# Patient Record
Sex: Female | Born: 1995 | Race: Black or African American | Hispanic: No | Marital: Single | Smoking: Never smoker
Health system: Southern US, Community
[De-identification: ages and names within clinical notes are randomized; demographics above are authoritative.]

## PROBLEM LIST (undated history)

## (undated) DIAGNOSIS — J45909 Unspecified asthma, uncomplicated: Secondary | ICD-10-CM

---
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
## 2016-02-07 ENCOUNTER — Ambulatory Visit (INDEPENDENT_AMBULATORY_CARE_PROVIDER_SITE_OTHER): Payer: Medicaid Other | Admitting: *Deleted

## 2016-02-07 DIAGNOSIS — Z3049 Encounter for surveillance of other contraceptives: Secondary | ICD-10-CM | POA: Diagnosis not present

## 2016-02-07 DIAGNOSIS — Z3042 Encounter for surveillance of injectable contraceptive: Secondary | ICD-10-CM

## 2016-02-07 MED ORDER — MEDROXYPROGESTERONE ACETATE 150 MG/ML IM SUSP
150.0000 mg | Freq: Once | INTRAMUSCULAR | Status: AC
  Administered 2016-02-07: 150 mg via INTRAMUSCULAR

## 2016-02-07 NOTE — Progress Notes (Signed)
Pt presents for depo injection. Pt within depo window, no urine hcg needed. Injection given, tolerated well. F/u depo injection visit scheduled.   

## 2016-04-25 ENCOUNTER — Ambulatory Visit (INDEPENDENT_AMBULATORY_CARE_PROVIDER_SITE_OTHER): Payer: Medicaid Other | Admitting: *Deleted

## 2016-04-25 DIAGNOSIS — Z3042 Encounter for surveillance of injectable contraceptive: Secondary | ICD-10-CM

## 2016-04-25 DIAGNOSIS — Z3049 Encounter for surveillance of other contraceptives: Secondary | ICD-10-CM | POA: Diagnosis not present

## 2016-04-25 MED ORDER — MEDROXYPROGESTERONE ACETATE 150 MG/ML IM SUSP
150.0000 mg | Freq: Once | INTRAMUSCULAR | Status: AC
  Administered 2016-04-25: 150 mg via INTRAMUSCULAR

## 2016-04-25 NOTE — Progress Notes (Signed)
Pt presents for depo injection. Pt within depo window, no urine hcg needed. Injection given, tolerated well. F/u depo injection visit scheduled.   

## 2016-07-11 ENCOUNTER — Ambulatory Visit: Payer: Self-pay | Admitting: *Deleted

## 2016-07-19 ENCOUNTER — Ambulatory Visit (INDEPENDENT_AMBULATORY_CARE_PROVIDER_SITE_OTHER): Payer: Medicaid Other | Admitting: *Deleted

## 2016-07-19 DIAGNOSIS — Z3049 Encounter for surveillance of other contraceptives: Secondary | ICD-10-CM | POA: Diagnosis not present

## 2016-07-19 MED ORDER — MEDROXYPROGESTERONE ACETATE 150 MG/ML IM SUSP
150.0000 mg | Freq: Once | INTRAMUSCULAR | Status: AC
  Administered 2016-07-19: 150 mg via INTRAMUSCULAR

## 2016-07-19 NOTE — Progress Notes (Signed)
Pt presents for depo injection. Pt within depo window, no urine hcg needed. Injection given, tolerated well. F/u depo injection visit scheduled.   

## 2016-10-04 ENCOUNTER — Ambulatory Visit (INDEPENDENT_AMBULATORY_CARE_PROVIDER_SITE_OTHER): Payer: Medicaid Other | Admitting: *Deleted

## 2016-10-04 DIAGNOSIS — Z3049 Encounter for surveillance of other contraceptives: Secondary | ICD-10-CM | POA: Diagnosis not present

## 2016-10-04 MED ORDER — MEDROXYPROGESTERONE ACETATE 150 MG/ML IM SUSP
150.0000 mg | Freq: Once | INTRAMUSCULAR | Status: AC
  Administered 2016-10-04: 150 mg via INTRAMUSCULAR

## 2016-10-04 NOTE — Progress Notes (Signed)
Patient within Depo window, shot given in left deltoid. Tolerated well. Appointment scheduled for 12/20/2016.

## 2016-12-20 ENCOUNTER — Ambulatory Visit (INDEPENDENT_AMBULATORY_CARE_PROVIDER_SITE_OTHER): Payer: Medicaid Other | Admitting: *Deleted

## 2016-12-20 DIAGNOSIS — Z3049 Encounter for surveillance of other contraceptives: Secondary | ICD-10-CM

## 2016-12-20 DIAGNOSIS — Z3042 Encounter for surveillance of injectable contraceptive: Secondary | ICD-10-CM

## 2016-12-20 MED ORDER — MEDROXYPROGESTERONE ACETATE 150 MG/ML IM SUSP
150.0000 mg | Freq: Once | INTRAMUSCULAR | Status: AC
  Administered 2016-12-20: 150 mg via INTRAMUSCULAR

## 2016-12-20 NOTE — Progress Notes (Signed)
Pt presents for depo injection. Pt within depo window, no urine hcg needed. Injection given, tolerated well. F/u depo injection visit scheduled.   

## 2017-03-06 ENCOUNTER — Ambulatory Visit (INDEPENDENT_AMBULATORY_CARE_PROVIDER_SITE_OTHER): Payer: Medicaid Other

## 2017-03-06 DIAGNOSIS — Z3042 Encounter for surveillance of injectable contraceptive: Secondary | ICD-10-CM

## 2017-03-06 DIAGNOSIS — Z113 Encounter for screening for infections with a predominantly sexual mode of transmission: Secondary | ICD-10-CM | POA: Diagnosis not present

## 2017-03-06 LAB — POCT RAPID HIV: RAPID HIV, POC: NEGATIVE

## 2017-03-06 MED ORDER — MEDROXYPROGESTERONE ACETATE 150 MG/ML IM SUSP
150.0000 mg | Freq: Once | INTRAMUSCULAR | Status: AC
Start: 2017-03-06 — End: 2017-03-06
  Administered 2017-03-06: 150 mg via INTRAMUSCULAR

## 2017-03-06 NOTE — Progress Notes (Signed)
Pt presents for depo injection. Pt within depo window, no urine hcg needed. Injection given, tolerated well. F/u depo injection visit scheduled.   [phone] is the patient's confidential number. F/u appt made with provider for depo and to discuss red pod referral to adolescent specialty or to consult pt about considering transitioning to family medicine.

## 2017-03-07 ENCOUNTER — Ambulatory Visit: Payer: Self-pay

## 2017-03-07 LAB — GC/CHLAMYDIA PROBE AMP
CT PROBE, AMP APTIMA: DETECTED — AB
GC Probe RNA: NOT DETECTED

## 2017-03-09 ENCOUNTER — Encounter: Payer: Self-pay | Admitting: Pediatrics

## 2017-03-09 ENCOUNTER — Ambulatory Visit (INDEPENDENT_AMBULATORY_CARE_PROVIDER_SITE_OTHER): Payer: Medicaid Other | Admitting: Pediatrics

## 2017-03-09 VITALS — Temp 97.8°F | Wt 145.2 lb

## 2017-03-09 DIAGNOSIS — A749 Chlamydial infection, unspecified: Secondary | ICD-10-CM | POA: Diagnosis not present

## 2017-03-09 DIAGNOSIS — Z113 Encounter for screening for infections with a predominantly sexual mode of transmission: Secondary | ICD-10-CM | POA: Diagnosis not present

## 2017-03-09 NOTE — Progress Notes (Signed)
   Subjective:     Consepcion HearingZawadi A Mcdowell, is a 21 y.o. female  HPI  Chief Complaint  Patient presents with  . other    treatment    Has been on Depo, no menses for a while, more than one years,  Wants to continue  Seen 03/06/16 for Depo, and was screened for STI as part of routine screening,  Seen regularly for Depo, and has not been seen for a provider visit since 05/2015,  On screening 03/06/17, chlamydia positive and GC negative, POCT test HIV neg,  Recent sexually hx:  Number of partner in lifetime: one, sexual debut one month ago Number of partners in last year:one  Female or female?: Symptoms of vaginal discharge change: female Dysuria? no Pain with intercourse: no Condoms: no used  Review of Systems   The following portions of the patient's history were reviewed and updated as appropriate: allergies, current medications, past medical history, past surgical history and problem list.     Objective:     Weight 145 lb 3.2 oz (65.9 kg), last menstrual period 02/07/2017.  Physical Exam  Constitutional: She appears well-nourished. No distress.  HENT:  Head: Normocephalic and atraumatic.  Right Ear: External ear normal.  Left Ear: External ear normal.  Nose: Nose normal.  Mouth/Throat: Oropharynx is clear and moist.  Eyes: Conjunctivae and EOM are normal. Right eye exhibits no discharge. Left eye exhibits no discharge.  Neck: Normal range of motion.  Cardiovascular: Normal rate, regular rhythm and normal heart sounds.   Pulmonary/Chest: No respiratory distress. She has no wheezes. She has no rales.  Abdominal: Soft. She exhibits no distension. There is no tenderness.  Skin: Skin is warm and dry. No rash noted.       Assessment & Plan:   1. Chlamydia  Treated in clinic with Azithromycin 1000 gm PO,  Given expidited partner therapy and information   2. Routine screening for STI (sexually transmitted infection) - RPR  Has had more than 2 year of Deo and is very  pleased with the amenorrhea, but should consider reviewing bone densitey  Reviewed need for Calcium, 1300 mg a day minimun and vit D Please return  Adolescent team provider   Supportive care and return precautions reviewed.  Spent  25  minutes face to face time with patient; greater than 50% spent in counseling regarding diagnosis and treatment plan.   Theadore NanMCCORMICK, Emily Martinovich, MD

## 2017-03-10 LAB — RPR

## 2017-03-21 NOTE — Progress Notes (Signed)
Called pt but no answer after 10 rings. Will try again later.

## 2017-03-22 ENCOUNTER — Ambulatory Visit (INDEPENDENT_AMBULATORY_CARE_PROVIDER_SITE_OTHER): Payer: Medicaid Other | Admitting: Pediatrics

## 2017-03-22 ENCOUNTER — Encounter: Payer: Self-pay | Admitting: Pediatrics

## 2017-03-22 VITALS — BP 95/64 | HR 76 | Ht 63.5 in | Wt 142.6 lb

## 2017-03-22 DIAGNOSIS — Z3042 Encounter for surveillance of injectable contraceptive: Secondary | ICD-10-CM | POA: Diagnosis not present

## 2017-03-22 DIAGNOSIS — Z113 Encounter for screening for infections with a predominantly sexual mode of transmission: Secondary | ICD-10-CM

## 2017-03-22 NOTE — Progress Notes (Signed)
THIS RECORD MAY CONTAIN CONFIDENTIAL INFORMATION THAT SHOULD NOT BE RELEASED WITHOUT REVIEW OF THE SERVICE PROVIDER.  Adolescent Medicine Consultation Follow-Up Visit Emily Mcdowell  is a 21 y.o. female referred by Maree ErieStanley, Angela J, MD here today for follow-up regarding depo.    Never seen in adolescent clinic.   Plan at last visit included tx for chlamydia .  Pertinent Labs? Yes,  Results for orders placed or performed in visit on 03/09/17  RPR  Result Value Ref Range   RPR Ser Ql NON REAC NON REAC    Growth Chart Viewed? yes   History was provided by the patient.  Interpreter? no  PCP Confirmed?  yes  My Chart Activated?   no   Chief Complaint  Patient presents with  . Follow-up  . reproductive health    HPI:    Happy to hear that RPR and HIV are negative. She provided EPT to her partner and they waited 7 days to be sexually active after treatment. No symptoms today. Denies pain with sex.   Happy with depo at this time. May consider nexplanon or IUD as future option as she will not remember to take pills.   Review of Systems  Constitutional: Negative for malaise/fatigue.  Eyes: Negative for double vision.  Respiratory: Negative for shortness of breath.   Cardiovascular: Negative for chest pain and palpitations.  Gastrointestinal: Negative for abdominal pain, constipation, diarrhea, nausea and vomiting.  Genitourinary: Negative for dysuria.  Musculoskeletal: Negative for joint pain and myalgias.  Skin: Negative for rash.  Neurological: Negative for dizziness and headaches.  Endo/Heme/Allergies: Does not bruise/bleed easily.     No LMP recorded (lmp unknown). Patient has had an injection. No Known Allergies Outpatient Medications Prior to Visit  Medication Sig Dispense Refill  . albuterol (PROVENTIL HFA;VENTOLIN HFA) 108 (90 BASE) MCG/ACT inhaler Inhale 2 puffs into the lungs every 4 (four) hours as needed for wheezing. 1 Inhaler 3  . beclomethasone (QVAR)  80 MCG/ACT inhaler Inhale 1 puff into the lungs 2 (two) times daily. 1 Inhaler 12   No facility-administered medications prior to visit.      Patient Active Problem List   Diagnosis Date Noted  . EXCESSIVE MENSTRUAL BLEEDING 09/30/2010  . IRREGULAR MENSES 04/01/2010  . PERSONAL HISTORY CONTACT WITH & EXPOSURE TO LEAD 08/10/2009  . ALLERGIC RHINITIS 02/28/2005  . Asthma 01/19/2005     The following portions of the patient's history were reviewed and updated as appropriate: allergies, current medications, past family history, past medical history, past social history, past surgical history and problem list.  Physical Exam:  Vitals:   03/22/17 1528  BP: 95/64  Pulse: 76  Weight: 142 lb 9.6 oz (64.7 kg)  Height: 5' 3.5" (1.613 m)   BP 95/64 (BP Location: Right Arm, Patient Position: Sitting, Cuff Size: Normal)   Pulse 76   Ht 5' 3.5" (1.613 m)   Wt 142 lb 9.6 oz (64.7 kg)   LMP  (LMP Unknown)   BMI 24.86 kg/m  Body mass index: body mass index is 24.86 kg/m. Growth percentile SmartLinks can only be used for patients less than 20 years old.   Physical Exam  Constitutional: She appears well-developed. No distress.  HENT:  Mouth/Throat: Oropharynx is clear and moist.  Neck: No thyromegaly present.  Cardiovascular: Normal rate and regular rhythm.   No murmur heard. Pulmonary/Chest: Breath sounds normal.  Abdominal: Soft. She exhibits no mass. There is no tenderness. There is no guarding.  Musculoskeletal: She exhibits no  edema.  Lymphadenopathy:    She has no cervical adenopathy.  Neurological: She is alert.  Skin: Skin is warm. No rash noted.  Psychiatric: She has a normal mood and affect.  Nursing note and vitals reviewed.   Assessment/Plan: 1. Depo-Provera contraceptive status Discussed risks of long term depo use and bone thinning. Current guidelines do not recommend BMD screening. Reinforced good calcium and vit d intake. Provided more info on iUD and nexplanon for  possible future use.   2. Routine screening for STI (sexually transmitted infection) Will rescreen for test of reinfection.  - GC/Chlamydia Probe Amp    Follow-up:  3 months for nurse visit for depo   Medical decision-making:  >15 minutes spent face to face with patient with more than 50% of appointment spent discussing diagnosis, management, follow-up, and reviewing of depo, STI screening, other contraceptives .

## 2017-03-22 NOTE — Patient Instructions (Signed)
All labs negative.  [email or web address]

## 2017-03-22 NOTE — Progress Notes (Signed)
Patient has set up appt for treatment of +urine today, can get results at visit. Asked C Maxwell CaulHacker to let me know if misses appt.

## 2017-03-28 ENCOUNTER — Encounter: Payer: Self-pay | Admitting: Pediatrics

## 2017-04-02 ENCOUNTER — Other Ambulatory Visit: Payer: Self-pay | Admitting: Pediatrics

## 2017-04-02 DIAGNOSIS — A749 Chlamydial infection, unspecified: Secondary | ICD-10-CM

## 2017-04-05 ENCOUNTER — Ambulatory Visit (INDEPENDENT_AMBULATORY_CARE_PROVIDER_SITE_OTHER): Payer: Medicaid Other

## 2017-04-05 VITALS — Ht 66.93 in | Wt 121.8 lb

## 2017-04-05 DIAGNOSIS — Z113 Encounter for screening for infections with a predominantly sexual mode of transmission: Secondary | ICD-10-CM

## 2017-04-06 LAB — GC/CHLAMYDIA PROBE AMP
CT Probe RNA: NOT DETECTED
GC Probe RNA: NOT DETECTED

## 2017-04-06 NOTE — Progress Notes (Signed)
Pt here today to recollect urine specimen for GC/Chl testing. Specimen obtained and sent to [facility].

## 2017-05-22 ENCOUNTER — Ambulatory Visit: Payer: Self-pay

## 2017-05-24 ENCOUNTER — Ambulatory Visit (INDEPENDENT_AMBULATORY_CARE_PROVIDER_SITE_OTHER): Payer: Medicaid Other

## 2017-05-24 ENCOUNTER — Ambulatory Visit: Payer: Self-pay | Admitting: Pediatrics

## 2017-05-24 DIAGNOSIS — Z3042 Encounter for surveillance of injectable contraceptive: Secondary | ICD-10-CM

## 2017-05-24 MED ORDER — MEDROXYPROGESTERONE ACETATE 150 MG/ML IM SUSP
150.0000 mg | Freq: Once | INTRAMUSCULAR | Status: AC
Start: 2017-05-24 — End: 2017-05-24
  Administered 2017-05-24: 150 mg via INTRAMUSCULAR

## 2017-05-24 NOTE — Progress Notes (Signed)
Pt presents for depo injection. Pt 2 weeks early for depo window, no urine hcg needed. Injection given, tolerated well. F/u depo injection visit scheduled. Pt still contemplating whether IUD or Nexplanon she plans to call office once she has decided on different birth control option.

## 2017-08-09 ENCOUNTER — Ambulatory Visit (INDEPENDENT_AMBULATORY_CARE_PROVIDER_SITE_OTHER): Payer: Medicaid Other

## 2017-08-09 DIAGNOSIS — Z3049 Encounter for surveillance of other contraceptives: Secondary | ICD-10-CM | POA: Diagnosis not present

## 2017-08-09 DIAGNOSIS — Z3042 Encounter for surveillance of injectable contraceptive: Secondary | ICD-10-CM

## 2017-08-09 MED ORDER — MEDROXYPROGESTERONE ACETATE 150 MG/ML IM SUSP
150.0000 mg | Freq: Once | INTRAMUSCULAR | Status: AC
  Administered 2017-08-09: 150 mg via INTRAMUSCULAR

## 2017-08-09 NOTE — Progress Notes (Signed)
Pt presents for depo injection. Pt within depo window, no urine hcg needed. Injection given, tolerated well. F/u depo injection visit scheduled.   

## 2017-10-25 ENCOUNTER — Ambulatory Visit (INDEPENDENT_AMBULATORY_CARE_PROVIDER_SITE_OTHER): Payer: Medicaid Other

## 2017-10-25 DIAGNOSIS — Z3049 Encounter for surveillance of other contraceptives: Secondary | ICD-10-CM | POA: Diagnosis not present

## 2017-10-25 DIAGNOSIS — Z113 Encounter for screening for infections with a predominantly sexual mode of transmission: Secondary | ICD-10-CM

## 2017-10-25 DIAGNOSIS — Z3042 Encounter for surveillance of injectable contraceptive: Secondary | ICD-10-CM

## 2017-10-25 MED ORDER — MEDROXYPROGESTERONE ACETATE 150 MG/ML IM SUSP
150.0000 mg | Freq: Once | INTRAMUSCULAR | Status: AC
  Administered 2017-10-25: 150 mg via INTRAMUSCULAR

## 2017-10-25 NOTE — Progress Notes (Signed)
Pt presents for depo injection. Pt within depo window, no urine hcg needed. Injection given, tolerated well. F/u depo injection visit scheduled.   

## 2017-10-26 LAB — C. TRACHOMATIS/N. GONORRHOEAE RNA
C. trachomatis RNA, TMA: DETECTED — AB
N. GONORRHOEAE RNA, TMA: NOT DETECTED

## 2017-10-30 ENCOUNTER — Telehealth: Payer: Self-pay

## 2017-10-30 NOTE — Telephone Encounter (Signed)
Emily Mcdowell notified of positive chlaymydia. Appointment scheduled for her and her partner to be treated.

## 2017-11-01 ENCOUNTER — Ambulatory Visit (INDEPENDENT_AMBULATORY_CARE_PROVIDER_SITE_OTHER): Payer: Medicaid Other | Admitting: Pediatrics

## 2017-11-01 VITALS — BP 115/68 | HR 85 | Ht 64.17 in | Wt 145.2 lb

## 2017-11-01 DIAGNOSIS — A749 Chlamydial infection, unspecified: Secondary | ICD-10-CM | POA: Diagnosis not present

## 2017-11-01 MED ORDER — AZITHROMYCIN 500 MG PO TABS
1000.0000 mg | ORAL_TABLET | Freq: Once | ORAL | Status: AC
  Administered 2017-11-01: 1000 mg via ORAL

## 2017-11-01 NOTE — Patient Instructions (Signed)
Chlamydia, Female Chlamydia is an STD (sexually transmitted disease). This is an infection that spreads through sexual contact. If it is not treated, it can cause serious problems. It must be treated with antibiotic medicine. Sometimes, you may not have symptoms (asymptomatic). When you have symptoms, they can include:  Burning when you pee (urinate).  Peeing often.  Fluid (discharge) coming from the vagina.  Redness, soreness, and swelling (inflammation) of the butt (rectum).  Bleeding or fluid coming from the butt.  Belly (abdominal) pain.  Pain during sex.  Bleeding between periods.  Itching, burning, or redness in the eyes.  Fluid coming from the eyes.  Follow these instructions at home: Medicines  Take over-the-counter and prescription medicines only as told by your doctor.  Take your antibiotic medicine as told by your doctor. Do not stop taking the antibiotic even if you start to feel better. Sexual activity  Tell sex partners about your infection. Sex partners are people you had oral, anal, or vaginal sex with within 60 days of when you started getting sick. They need treatment, too.  Do not have sex until: ? You and your sex partners have been treated. ? Your doctor says it is okay.  If you have a single dose treatment, wait 7 days before having sex. General instructions  It is up to you to get your test results. Ask your doctor when your results will be ready.  Get a lot of rest.  Eat healthy foods.  Drink enough fluid to keep your pee (urine) clear or pale yellow.  Keep all follow-up visits as told by your doctor. You may need tests after 3 months. Preventing chlamydia  The only way to prevent chlamydia is not to have sex. To lower your risk: ? Use latex condoms correctly. Do this every time you have sex. ? Avoid having many sex partners. ? Ask if your partner has been tested for STDs and if he or she had negative results. Contact a doctor if:  You  get new symptoms.  You do not get better with treatment.  You have a fever or chills.  You have pain during sex. Get help right away if:  Your pain gets worse and does not get better with medicine.  You get flu-like symptoms, such as: ? Night sweats. ? Sore throat. ? Muscle aches.  You feel sick to your stomach (nauseous).  You throw up (vomit).  You have trouble swallowing.  You have bleeding: ? Between periods. ? After sex.  You have irregular periods.  You have belly pain that does not get better with medicine.  You have lower back pain that does not get better with medicine.  You feel weak or dizzy.  You pass out (faint).  You are pregnant and you get symptoms of chlamydia. Summary  Chlamydia is an infection that spreads through sexual contact.  Sometimes, chlamydia can cause no symptoms (asymptomatic).  Do not have sex until your doctor says it is okay.  All sex partners will have to be treated for chlamydia. This information is not intended to replace advice given to you by your health care provider. Make sure you discuss any questions you have with your health care provider. Document Released: 05/16/2008 Document Revised: 07/27/2016 Document Reviewed: 07/27/2016 Elsevier Interactive Patient Education  2017 [employer].    Safe Sex Practicing safe sex means taking steps before and during sex to reduce your risk of:  Getting an STD (sexually transmitted disease).  Giving your partner an  STD.  Unwanted pregnancy.  How can I practice safe sex?  To practice safe sex:  Limit your sexual partners to only one partner who is having sex with only you.  Avoid using alcohol and recreational drugs before having sex. These substances can affect your judgment.  Before having sex with a new partner: ? Talk to your partner about past partners, past STDs, and drug use. ? You and your partner should be screened for STDs and discuss the results with each  other.  Check your body regularly for sores, blisters, rashes, or unusual discharge. If you notice any of these problems, visit your health care provider.  If you have symptoms of an infection or you are being treated for an STD, avoid sexual contact.  While having sex, use a condom. Make sure to: ? Use a condom every time you have vaginal, oral, or anal sex. Both females and males should wear condoms during oral sex. ? Keep condoms in place from the beginning to the end of sexual activity. ? Use a latex condom, if possible. Latex condoms offer the best protection. ? Use only water-based lubricants or oils to lubricate a condom. Using petroleum-based lubricants or oils will weaken the condom and increase the chance that it will break.  See your health care provider for regular screenings, exams, and tests for STDs.  Talk with your health care provider about the form of birth control (contraception) that is best for you.  Get vaccinated against hepatitis B and human papillomavirus (HPV).  If you are at risk of being infected with HIV (human immunodeficiency virus), talk with your health care provider about taking a prescription medicine to prevent HIV infection. You are considered at risk for HIV if: ? You are a man who has sex with other men. ? You are a heterosexual man or woman who is sexually active with more than one partner. ? You take drugs by injection. ? You are sexually active with a partner who has HIV.  This information is not intended to replace advice given to you by your health care provider. Make sure you discuss any questions you have with your health care provider. Document Released: 09/14/2004 Document Revised: 12/22/2015 Document Reviewed: 06/27/2015 Elsevier Interactive Patient Education  [employer]2018 Elsevier Inc.

## 2017-11-01 NOTE — Progress Notes (Signed)
History was provided by the patient.  Emily Mcdowell is a 22 y.o. female who is here for positive Chlamydia test.     HPI:   [age] year old female with history of previous +Chlamydia infection presents for treatment after positive Chlamydia test on 3/7. Patient reports that she has been with same partner since previous infection and that he took the antibiotic during that infection as well. She denies vaginal discharge, pelvic pain, abnormal uterine bleeding, dysuria, and fevers. She is using DepoProvera for contraception and recently received an injection. She declines need for condoms.    The following portions of the patient's history were reviewed and updated as appropriate: allergies, current medications, past family history, past medical history, past social history, past surgical history and problem list.  Physical Exam:  BP 115/68   Pulse 85   Ht 5' 4.17" (1.63 m)   Wt 145 lb 3.2 oz (65.9 kg)   BMI 24.79 kg/m    General:   alert, cooperative and no distress  Skin:   normal  Oral cavity:   lips, mucosa, and tongue normal; teeth and gums normal  Eyes:   sclerae white  Nose: clear, no discharge  Lungs:  clear to auscultation bilaterally  Heart:   regular rate and rhythm, S1, S2 normal, no murmur, click, rub or gallop   Abdomen:  soft, non-tender; bowel sounds normal; no masses,  no organomegaly  Extremities:   extremities normal, atraumatic, no cyanosis or edema  Neuro:  normal without focal findings, mental status, speech normal, alert and oriented x3 and muscle tone and strength normal and symmetric    Assessment/Plan:  1. Chlamydia Patient with +Chlamydia infection, asymptomatic. Treated with direct observation therapy with Azithromycin. Expedited partner therapy given to the patient. Discussed how this infection is transmitted. Recommended abstaining from sexual intercourse for at least 1 week after treatment. Importance of condom use to prevent the spread of STIs discussed.  Patient will need repeat testing in 3 months given this infection to ensure she has not been re-infected.  - azithromycin (ZITHROMAX) tablet 1,000 mg  De Hollingsheadatherine L Gaetano Romberger, DO 11/01/17

## 2017-12-26 ENCOUNTER — Encounter (HOSPITAL_COMMUNITY): Payer: Self-pay | Admitting: Emergency Medicine

## 2017-12-26 ENCOUNTER — Emergency Department (HOSPITAL_COMMUNITY)
Admission: EM | Admit: 2017-12-26 | Discharge: 2017-12-27 | Discharge status: Against Medical Advice | Payer: Self-pay | Attending: Emergency Medicine | Admitting: Emergency Medicine

## 2017-12-26 ENCOUNTER — Other Ambulatory Visit: Payer: Self-pay

## 2017-12-26 DIAGNOSIS — R51 Headache: Secondary | ICD-10-CM | POA: Insufficient documentation

## 2017-12-26 NOTE — ED Triage Notes (Signed)
Pt reports headache for the last 3 days on the right side. Pt reports nausea and one episode of vomiting.

## 2017-12-27 NOTE — ED Notes (Signed)
No answer when called for rooming  

## 2017-12-31 ENCOUNTER — Telehealth: Payer: Self-pay

## 2017-12-31 NOTE — Telephone Encounter (Signed)
Emily Mcdowell is asking if she is cured of chlamydia. Explained that test of cure is usually done 3 months after TX which would be the middle of June. She has an appointment with [facility] nurse 01/09/2018 and plan to talk to RN about it then.

## 2017-12-31 NOTE — Telephone Encounter (Signed)
Noted. Will test for cure at visit.

## 2018-01-09 ENCOUNTER — Ambulatory Visit: Payer: Medicaid Other

## 2018-01-10 ENCOUNTER — Ambulatory Visit (INDEPENDENT_AMBULATORY_CARE_PROVIDER_SITE_OTHER): Payer: Medicaid Other

## 2018-01-10 DIAGNOSIS — Z113 Encounter for screening for infections with a predominantly sexual mode of transmission: Secondary | ICD-10-CM | POA: Diagnosis not present

## 2018-01-10 NOTE — Addendum Note (Signed)
Addended by: Debroah Loop on: 01/10/2018 04:55 PM   Modules accepted: Orders

## 2018-01-10 NOTE — Progress Notes (Signed)
Pt came into clinic requesting test of cure for Chlamydia. She reports she no longer wants on birth control and is ready to try to conceive. Gave patient education and requested patient start on a prenatal vitamin. Offered follow up appointment with provider and pt declined appointment to discuss. Will call patient to disclose lab results.

## 2018-01-11 LAB — C. TRACHOMATIS/N. GONORRHOEAE RNA
C. TRACHOMATIS RNA, TMA: NOT DETECTED
N. GONORRHOEAE RNA, TMA: NOT DETECTED

## 2018-01-30 ENCOUNTER — Ambulatory Visit (INDEPENDENT_AMBULATORY_CARE_PROVIDER_SITE_OTHER): Payer: Medicaid Other | Admitting: Pediatrics

## 2018-01-30 ENCOUNTER — Encounter: Payer: Self-pay | Admitting: Pediatrics

## 2018-01-30 VITALS — BP 96/65 | HR 79 | Ht 63.0 in | Wt 156.6 lb

## 2018-01-30 DIAGNOSIS — Z3202 Encounter for pregnancy test, result negative: Secondary | ICD-10-CM

## 2018-01-30 DIAGNOSIS — Z30017 Encounter for initial prescription of implantable subdermal contraceptive: Secondary | ICD-10-CM

## 2018-01-30 LAB — POCT URINE PREGNANCY: Preg Test, Ur: NEGATIVE

## 2018-01-30 MED ORDER — ETONOGESTREL 68 MG ~~LOC~~ IMPL
68.0000 mg | DRUG_IMPLANT | Freq: Once | SUBCUTANEOUS | Status: AC
Start: 2018-01-30 — End: 2018-01-30
  Administered 2018-01-30: 68 mg via SUBCUTANEOUS

## 2018-01-30 NOTE — Patient Instructions (Signed)
If you need us, come back and see us!   Congratulations on getting your Nexplanon placement!  Below is some important information about Nexplanon.  First remember that Nexplanon does not prevent sexually transmitted infections.  Condoms will help prevent sexually transmitted infections. The Nexplanon starts working 7 days after it was inserted.  There is a risk of getting pregnant if you have unprotected sex in those first 7 days after placement of the Nexplanon.  The Nexplanon lasts for 3 years but can be removed at any time.  You can become pregnant as early as 1 week after removal.  You can have a new Nexplanon put in after the old one is removed if you like.  It is not known whether Nexplanon is as effective in women who are very overweight because the studies did not include many overweight women.  Nexplanon interacts with some medications, including barbiturates, bosentan, carbamazepine, felbamate, griseofulvin, oxcarbazepine, phenytoin, rifampin, St. John's wort, topiramate, HIV medicines.  Please alert your doctor if you are on any of these medicines.  Always tell other healthcare providers that you have a Nexplanon in your arm.  The Nexplanon was placed just under the skin.  Leave the outside bandage on for 24 hours.  Leave the smaller bandage on for 3-5 days or until it falls off on its own.  Keep the area clean and dry for 3-5 days. There is usually bruising or swelling at the insertion site for a few days to a week after placement.  If you see redness or pus draining from the insertion site, call us immediately.  Keep your user card with the date the implant was placed and the date the implant is to be removed.  The most common side effect is a change in your menstrual bleeding pattern.   This bleeding is generally not harmful to you but can be annoying.  Call or come in to see us if you have any concerns about the bleeding or if you have any side effects or questions.    We will  call you in 1 week to check in and we would like you to return to the clinic for a follow-up visit in 1 month.  You can call [facility] 24 hours a day with any questions or concerns.  There is always a nurse or doctor available to take your call.  Call 9-1-1 if you have a life-threatening emergency.  For anything else, please call us at [phone] before heading to the ER.

## 2018-01-30 NOTE — Progress Notes (Signed)
History was provided by the patient.  Emily Mcdowell is a 22 y.o. female who is here for contraception.   PCP confirmed? Yes.    Maree ErieStanley, Angela J, MD  HPI:  Was on depo for quite some time. Has been on it about 3-4 years. Didn't have any periods with it. She started a period 2 days ago. It is light right now. She is having a lot of cramping. She wants nexplanon after our conversation. It has been > 1 month since sexually active. Other goals would be to reduce symptoms of not feeling well that she has with her periods including headaches. Denies migraine with aura or fam hx of blood clots.   Review of Systems  Constitutional: Negative for malaise/fatigue.  Eyes: Negative for double vision.  Respiratory: Negative for shortness of breath.   Cardiovascular: Negative for chest pain and palpitations.  Gastrointestinal: Negative for abdominal pain, constipation, diarrhea, nausea and vomiting.  Genitourinary: Negative for dysuria.  Musculoskeletal: Negative for joint pain and myalgias.  Skin: Negative for rash.  Neurological: Negative for dizziness and headaches.  Endo/Heme/Allergies: Does not bruise/bleed easily.     Patient Active Problem List   Diagnosis Date Noted  . EXCESSIVE MENSTRUAL BLEEDING 09/30/2010  . IRREGULAR MENSES 04/01/2010  . PERSONAL HISTORY CONTACT WITH & EXPOSURE TO LEAD 08/10/2009  . ALLERGIC RHINITIS 02/28/2005  . Asthma 01/19/2005    Current Outpatient Medications on File Prior to Visit  Medication Sig Dispense Refill  . albuterol (PROVENTIL HFA;VENTOLIN HFA) 108 (90 BASE) MCG/ACT inhaler Inhale 2 puffs into the lungs every 4 (four) hours as needed for wheezing. 1 Inhaler 3  . beclomethasone (QVAR) 80 MCG/ACT inhaler Inhale 1 puff into the lungs 2 (two) times daily. 1 Inhaler 12   No current facility-administered medications on file prior to visit.     No Known Allergies  Physical Exam:    Vitals:   01/30/18 1330  BP: 96/65  Pulse: 79  Weight: 156 lb  9.6 oz (71 kg)  Height: 5\' 3"  (1.6 m)    Growth percentile SmartLinks can only be used for patients less than 21 years old. Patient's last menstrual period was 01/28/2018 (exact date).  Physical Exam  Constitutional: She appears well-developed. No distress.  HENT:  Mouth/Throat: Oropharynx is clear and moist.  Neck: No thyromegaly present.  Cardiovascular: Normal rate and regular rhythm.  No murmur heard. Pulmonary/Chest: Breath sounds normal.  Abdominal: Soft. She exhibits no mass. There is no tenderness. There is no guarding.  Musculoskeletal: She exhibits no edema.  Lymphadenopathy:    She has no cervical adenopathy.  Neurological: She is alert.  Skin: Skin is warm. No rash noted.  Psychiatric: She has a normal mood and affect.  Nursing note and vitals reviewed.    Assessment/Plan: 1. Insertion of Nexplanon Opted for nexplanon today given her long history of depo use and discussion of bone loss over time. Placed without incident. Does not desire pregnancy at this time.  - Subdermal Etonogestrel Implant Insertion - etonogestrel (NEXPLANON) implant 68 mg  2. Pregnancy examination or test, negative result Per protocol.  - POCT urine pregnancy

## 2018-01-31 DIAGNOSIS — Z975 Presence of (intrauterine) contraceptive device: Secondary | ICD-10-CM | POA: Insufficient documentation

## 2018-01-31 NOTE — Procedures (Signed)
Nexplanon Insertion  No contraindications for placement.  No liver disease, no unexplained vaginal bleeding, no h/o breast cancer, no h/o blood clots.  Patient's last menstrual period was 01/28/2018 (exact date).  UHCG: neg   Last Unprotected sex:  >1 month ago  Risks & benefits of Nexplanon discussed The nexplanon device was [facility] and [facility]. Packaging instructions supplied to patient Consent form signed  The patient denies any allergies to anesthetics or antiseptics.  Procedure: Pt was placed in supine position. The left arm was flexed at the elbow and externally rotated so that her wrist was parallel to her ear The medial epicondyle of the left arm was identified The insertions site was marked 8 cm proximal to the medial epicondyle The insertion site was cleaned with Betadine The area surrounding the insertion site was covered with a sterile drape 1% lidocaine was injected just under the skin at the insertion site extending 4 cm proximally. The sterile preloaded disposable Nexaplanon applicator was removed from the sterile packaging The applicator needle was inserted at a 30 degree angle at 8 cm proximal to the medial epicondyle as marked The applicator was lowered to a horizontal position and advanced just under the skin for the full length of the needle The slider on the applicator was retracted fully while the applicator remained in the same position, then the applicator was removed. The implant was confirmed via palpation as being in position The implant position was demonstrated to the patient Pressure dressing was applied to the patient.  The patient was instructed to removed the pressure dressing in 24 hrs.  The patient was advised to move slowly from a supine to an upright position  The patient denied any concerns or complaints  The patient was instructed to schedule a follow-up appt in 1 month and to call sooner if any concerns.  The patient  acknowledged agreement and understanding of the plan.

## 2018-05-23 ENCOUNTER — Ambulatory Visit: Payer: Medicaid Other | Admitting: Pediatrics

## 2018-07-08 ENCOUNTER — Ambulatory Visit: Payer: Self-pay | Admitting: Pediatrics

## 2018-07-09 ENCOUNTER — Encounter (HOSPITAL_COMMUNITY): Payer: Self-pay | Admitting: Emergency Medicine

## 2018-07-09 ENCOUNTER — Ambulatory Visit (INDEPENDENT_AMBULATORY_CARE_PROVIDER_SITE_OTHER): Payer: Self-pay

## 2018-07-09 ENCOUNTER — Ambulatory Visit (HOSPITAL_COMMUNITY)
Admission: EM | Admit: 2018-07-09 | Discharge: 2018-07-09 | Disposition: A | Discharge status: Home / Self Care | Payer: Self-pay | Attending: Family Medicine | Admitting: Family Medicine

## 2018-07-09 DIAGNOSIS — K5900 Constipation, unspecified: Secondary | ICD-10-CM

## 2018-07-09 DIAGNOSIS — Z3202 Encounter for pregnancy test, result negative: Secondary | ICD-10-CM

## 2018-07-09 DIAGNOSIS — Z79899 Other long term (current) drug therapy: Secondary | ICD-10-CM | POA: Insufficient documentation

## 2018-07-09 DIAGNOSIS — J45909 Unspecified asthma, uncomplicated: Secondary | ICD-10-CM | POA: Insufficient documentation

## 2018-07-09 LAB — POCT URINALYSIS DIP (DEVICE)
Bilirubin Urine: NEGATIVE
GLUCOSE, UA: NEGATIVE mg/dL
HGB URINE DIPSTICK: NEGATIVE
KETONES UR: NEGATIVE mg/dL
Leukocytes, UA: NEGATIVE
Nitrite: NEGATIVE
PH: 7.5 (ref 5.0–8.0)
PROTEIN: NEGATIVE mg/dL
SPECIFIC GRAVITY, URINE: 1.02 (ref 1.005–1.030)
Urobilinogen, UA: 0.2 mg/dL (ref 0.0–1.0)

## 2018-07-09 LAB — POCT PREGNANCY, URINE: Preg Test, Ur: NEGATIVE

## 2018-07-09 MED ORDER — POLYETHYLENE GLYCOL 3350 17 GM/SCOOP PO POWD: 1.0000 | Freq: Once | ORAL | 0 refills | Status: AC

## 2018-07-09 NOTE — ED Triage Notes (Signed)
Pt sts lower abd pain x 2 weeks; pt sts has nexplanon x 1 year

## 2018-07-09 NOTE — Discharge Instructions (Addendum)
Your x-ray did reveal a moderate amount of stool in the colon which represents constipation We will do MiraLAX to treat the symptoms You can do this twice a day until you get a good bowel movement in your symptoms are relieved To prevent further constipation you could do once a day and then possibly once or twice a week after that Your urine was negative for pregnancy or infection We are sending the self swab to check for STDs, BV, yeast We will call you with any positive results and treat accordingly

## 2018-07-09 NOTE — ED Provider Notes (Signed)
[facility]    CSN: [id number] Arrival date & time: 07/09/18  1346     History   Chief Complaint Chief Complaint  Patient presents with  . Abdominal Pain    HPI CLARISSIA MCKEEN is a 22 y.o. female.   Pt is a 22 year old female that presents with generalized abd discomfort, bloating and decreased appetite x 2 weeks.  Symptoms have been waxing and waning.  She has done nothing to treat her symptoms.  She has had some slight nausea but no vomiting.  She reports her last bowel movement was yesterday morning and normal.  She is also had some increased vaginal discharge without odor, itching or irritation.  She is currently sexually active with one partner, unprotected.  She uses Nexplanon for birth control.  Denies any associated dysuria, hematuria, but has had some urinary frequency.      Past Medical History:  Diagnosis Date  . Asthma     Patient Active Problem List   Diagnosis Date Noted  . Nexplanon in place 01/31/2018  . EXCESSIVE MENSTRUAL BLEEDING 09/30/2010  . IRREGULAR MENSES 04/01/2010  . PERSONAL HISTORY CONTACT WITH & EXPOSURE TO LEAD 08/10/2009  . ALLERGIC RHINITIS 02/28/2005  . Asthma 01/19/2005    History reviewed. No pertinent surgical history.  OB History   None      Home Medications    Prior to Admission medications   Medication Sig Start Date End Date Taking? Authorizing Provider  albuterol (PROVENTIL HFA;VENTOLIN HFA) 108 (90 BASE) MCG/ACT inhaler Inhale 2 puffs into the lungs every 4 (four) hours as needed for wheezing. 08/01/12   Chatten, Katherine Basset, NP  beclomethasone (QVAR) 80 MCG/ACT inhaler Inhale 1 puff into the lungs 2 (two) times daily. 06/04/15   Mittie Bodo, MD  polyethylene glycol powder (GLYCOLAX/MIRALAX) powder Take 255 g by mouth once for 1 dose. 07/09/18 07/09/18  Janace Aris, NP    Family History History reviewed. No pertinent family history.  Social History Social History   Tobacco Use  . Smoking  status: Never Smoker  . Smokeless tobacco: Never Used  Substance Use Topics  . Alcohol use: Yes  . Drug use: Yes    Types: Marijuana     Allergies   Patient has no known allergies.   Review of Systems Review of Systems   Physical Exam Triage Vital Signs ED Triage Vitals  Enc Vitals Group     BP 07/09/18 1407 107/68     Pulse Rate 07/09/18 1407 (!) 102     Resp 07/09/18 1407 18     Temp 07/09/18 1407 98.4 F (36.9 C)     Temp Source 07/09/18 1407 Oral     SpO2 07/09/18 1407 97 %     Weight --      Height --      Head Circumference --      Peak Flow --      Pain Score 07/09/18 1408 5     Pain Loc --      Pain Edu? --      Excl. in GC? --    No data found.  Updated Vital Signs BP 107/68 (BP Location: Left Arm)   Pulse (!) 102   Temp 98.4 F (36.9 C) (Oral)   Resp 18   SpO2 97%   Visual Acuity Right Eye Distance:   Left Eye Distance:   Bilateral Distance:    Right Eye Near:   Left Eye Near:  Bilateral Near:     Physical Exam  Constitutional: She appears well-developed and well-nourished.  Non-toxic appearance. She does not appear ill.  HENT:  Head: Normocephalic and atraumatic.  Pulmonary/Chest: Effort normal.  Abdominal: Soft. Normal appearance. She exhibits distension. There is generalized tenderness.  Neurological: She is alert.  Skin: Skin is warm and dry.  Psychiatric: She has a normal mood and affect.  Nursing note and vitals reviewed.    UC Treatments / Results  Labs (all labs ordered are listed, but only abnormal results are displayed) Labs Reviewed  POCT URINALYSIS DIP (DEVICE)  POCT PREGNANCY, URINE  CERVICOVAGINAL ANCILLARY ONLY    EKG None  Radiology Dg Abd 1 View  Result Date: 07/09/2018 CLINICAL DATA:  Abdominal pain and nausea EXAM: ABDOMEN - 1 VIEW COMPARISON:  None. FINDINGS: There is diffuse stool throughout much of the colon. There is no bowel dilatation or air-fluid level to suggest bowel obstruction. No free air.  IMPRESSION: Diffuse stool throughout much of the colon. Suspect a degree of constipation. No bowel obstruction or free air. Electronically Signed   By: Bretta BangWilliam  Woodruff III M.D.   On: 07/09/2018 14:37    Procedures Procedures (including critical care time)  Medications Ordered in UC Medications - No data to display  Initial Impression / Assessment and Plan / UC Course  I have reviewed the triage vital signs and the nursing notes.  Pertinent labs & imaging results that were available during my care of the patient were reviewed by me and considered in my medical decision making (see chart for details).     Patient is a 22-year female presents with 2 weeks of waxing and waning abdominal discomfort, bloating. Urine was negative for infection or pregnancy X-ray revealed moderate amount of constipation We will treat this with MiraLAX Also sending self swab to test for BV, yeast, STDs as requested Lab results pending and will call with any positive results Final Clinical Impressions(s) / UC Diagnoses   Final diagnoses:  Constipation, unspecified constipation type     Discharge Instructions     Your x-ray did reveal a moderate amount of stool in the colon which represents constipation We will do MiraLAX to treat the symptoms You can do this twice a day until you get a good bowel movement in your symptoms are relieved To prevent further constipation you could do once a day and then possibly once or twice a week after that Your urine was negative for pregnancy or infection We are sending the self swab to check for STDs, BV, yeast We will call you with any positive results and treat accordingly    ED Prescriptions    Medication Sig Dispense Auth. Provider   polyethylene glycol powder (GLYCOLAX/MIRALAX) powder Take 255 g by mouth once for 1 dose. 255 g Dahlia ByesBast, Jacob Cicero A, NP     Controlled Substance Prescriptions [place] Controlled Substance Registry consulted? Not Applicable   Janace ArisBast,  Zanae Kuehnle A, NP 07/09/18 1527

## 2018-07-10 LAB — CERVICOVAGINAL ANCILLARY ONLY
Bacterial vaginitis: POSITIVE — AB
Candida vaginitis: NEGATIVE
Chlamydia: NEGATIVE
Neisseria Gonorrhea: POSITIVE — AB
Trichomonas: NEGATIVE

## 2018-07-15 ENCOUNTER — Telehealth (HOSPITAL_COMMUNITY): Payer: Self-pay

## 2018-07-15 MED ORDER — METRONIDAZOLE 500 MG PO TABS: 500.0000 mg | ORAL_TABLET | Freq: Two times a day (BID) | ORAL | 0 refills | Status: DC

## 2018-07-15 NOTE — Telephone Encounter (Signed)
Gonorrhea is positive. Patient should return as soon as possible to the urgent care for treatment with IM rocephin 250mg  and po zithromax 1g. Patient will not need to see a provider unless there are new symptoms she would like evaluated. Pt needs education to refrain from sexual intercourse for now and for 7 days after treatment to give the medicine time to work. Sexual partners need to be notified and tested/treated. Condoms may reduce risk of reinfection. [facility] notified.   Bacterial vaginosis is positive. This was not treated at the urgent care visit.  Flagyl 500 mg BID x 7 days #14 no refills sent to patients pharmacy of choice.    Attempted to call pt x 1 no answer LVMM

## 2018-07-17 ENCOUNTER — Telehealth (HOSPITAL_COMMUNITY): Payer: Self-pay | Admitting: Emergency Medicine

## 2018-07-17 NOTE — Telephone Encounter (Signed)
Attempted to reach patient. No answer at this time. Voicemail left.    

## 2018-07-20 ENCOUNTER — Telehealth (HOSPITAL_COMMUNITY): Payer: Self-pay | Admitting: Emergency Medicine

## 2018-07-20 NOTE — Telephone Encounter (Signed)
Attempted to reach patient x3. No answer at this time. Voicemail left. Letter sent    

## 2018-09-04 ENCOUNTER — Encounter: Payer: Self-pay | Admitting: Family

## 2018-09-04 ENCOUNTER — Ambulatory Visit (INDEPENDENT_AMBULATORY_CARE_PROVIDER_SITE_OTHER): Payer: Self-pay | Admitting: Family

## 2018-09-04 VITALS — BP 111/72 | HR 75 | Ht 63.0 in | Wt 161.6 lb

## 2018-09-04 DIAGNOSIS — Z975 Presence of (intrauterine) contraceptive device: Secondary | ICD-10-CM

## 2018-09-04 DIAGNOSIS — Z3169 Encounter for other general counseling and advice on procreation: Secondary | ICD-10-CM

## 2018-09-04 DIAGNOSIS — A549 Gonococcal infection, unspecified: Secondary | ICD-10-CM

## 2018-09-04 DIAGNOSIS — Z130 Encounter for screening for diseases of the blood and blood-forming organs and certain disorders involving the immune mechanism: Secondary | ICD-10-CM

## 2018-09-04 DIAGNOSIS — Z113 Encounter for screening for infections with a predominantly sexual mode of transmission: Secondary | ICD-10-CM

## 2018-09-04 LAB — POCT HEMOGLOBIN: Hemoglobin: 13.9 g/dL (ref 11–14.6)

## 2018-09-04 MED ORDER — PRENATAL VITAMINS 0.8 MG PO TABS: 1.0000 | ORAL_TABLET | Freq: Every day | ORAL | 2 refills | Status: DC

## 2018-09-04 MED ORDER — CEFTRIAXONE SODIUM 250 MG IJ SOLR
250.0000 mg | Freq: Once | INTRAMUSCULAR | Status: AC
  Administered 2018-09-04: 250 mg via INTRAMUSCULAR

## 2018-09-04 MED ORDER — AZITHROMYCIN 500 MG PO TABS
1000.0000 mg | ORAL_TABLET | Freq: Once | ORAL | Status: AC
  Administered 2018-09-04: 1000 mg via ORAL

## 2018-09-04 NOTE — Progress Notes (Signed)
History was provided by the patient.  Emily Mcdowell is a 23 y.o. female who is here for vaginal bleeding with nexplanon.   PCP confirmed? Yes.    Maree Erie, MD  HPI:   -she was seen on 11/19 and was diagnosed with gonorrhea  -she has been working a lot and was unable to go back to clinic for tx  -has been having a period for a month and wants her nexplanon removed  -she has a new partner -not always using condoms  -denies dyspareunia  -wants to have nexplanon removed -wants to have a baby to surprise her mom who wants grandchildren.  -new partner is on board with pregnancy    Review of Systems  Constitutional: Negative for malaise/fatigue.  Eyes: Negative for double vision.  Respiratory: Negative for shortness of breath.   Cardiovascular: Negative for chest pain and palpitations.  Gastrointestinal: Negative for abdominal pain, constipation, diarrhea, nausea and vomiting.  Genitourinary: Negative for dysuria.  Musculoskeletal: Negative for joint pain and myalgias.  Skin: Negative for rash.  Neurological: Negative for dizziness and headaches.  Endo/Heme/Allergies: Does not bruise/bleed easily.     Patient Active Problem List   Diagnosis Date Noted  . Nexplanon in place 01/31/2018  . EXCESSIVE MENSTRUAL BLEEDING 09/30/2010  . IRREGULAR MENSES 04/01/2010  . PERSONAL HISTORY CONTACT WITH & EXPOSURE TO LEAD 08/10/2009  . ALLERGIC RHINITIS 02/28/2005  . Asthma 01/19/2005    Current Outpatient Medications on File Prior to Visit  Medication Sig Dispense Refill  . albuterol (PROVENTIL HFA;VENTOLIN HFA) 108 (90 BASE) MCG/ACT inhaler Inhale 2 puffs into the lungs every 4 (four) hours as needed for wheezing. 1 Inhaler 3  . beclomethasone (QVAR) 80 MCG/ACT inhaler Inhale 1 puff into the lungs 2 (two) times daily. 1 Inhaler 12  . metroNIDAZOLE (FLAGYL) 500 MG tablet Take 1 tablet (500 mg total) by mouth 2 (two) times daily. (Patient not taking: Reported on 09/04/2018) 14  tablet 0   No current facility-administered medications on file prior to visit.     No Known Allergies  Physical Exam:    Vitals:   09/04/18 1356  BP: 111/72  Pulse: 75  Weight: 161 lb 9.6 oz (73.3 kg)  Height: 5\' 3"  (1.6 m)    Growth percentile SmartLinks can only be used for patients less than 81 years old. No LMP recorded. Patient has had an implant.  Physical Exam HENT:     Head: Normocephalic.     Nose: Nose normal.     Mouth/Throat:     Mouth: Mucous membranes are moist.  Eyes:     Extraocular Movements: Extraocular movements intact.     Pupils: Pupils are equal, round, and reactive to light.  Neck:     Musculoskeletal: Normal range of motion.  Cardiovascular:     Rate and Rhythm: Normal rate.  Pulmonary:     Effort: Pulmonary effort is normal.  Abdominal:     General: Abdomen is flat. There is no distension.     Tenderness: There is no guarding.  Genitourinary:    General: Normal vulva.     Vagina: Vaginal discharge (white-yellow frothy discharge from os, No CMT or adnexal tenderness ) present.     Rectum: Normal.  Musculoskeletal: Normal range of motion.  Neurological:     Mental Status: She is alert.     Assessment/Plan: 1. Gonorrhea -will treat for gonorrhea today  -pelvic was not concerning for  - azithromycin (ZITHROMAX) tablet 1,000 mg - cefTRIAXone (ROCEPHIN)  injection 250 mg  2. Nexplanon in place -discussed need for treating infections and starting PNV if interested in conceiving -she is open to waiting until results of today's visit and cleared from infection then will schedule removal   3. Screening for iron deficiency anemia Lab Results  Component Value Date   HGB 13.9 09/04/2018  -reassurance given; likely gonorrhea is causing her vaginal bleeding  - POCT hemoglobin  4. Routine screening for STI (sexually transmitted infection) -will swab today; treating for gonorrhea today  -will await labs for other; no indication to treat for  PID today  - C. trachomatis/N. gonorrhoeae RNA - WET PREP BY MOLECULAR PROBE  5. Encounter for preconception consultation -as above, partner needs to be treated  -she is going to schedule/have him schedule a follow up  -reviewed start of prenatal vitamins - Prenatal Multivit-Min-Fe-FA (PRENATAL VITAMINS) 0.8 MG tablet; Take 1 tablet by mouth daily.  Dispense: 30 tablet; Refill: 2

## 2018-09-05 ENCOUNTER — Other Ambulatory Visit: Payer: Self-pay | Admitting: Pediatrics

## 2018-09-05 LAB — WET PREP BY MOLECULAR PROBE
Candida species: NOT DETECTED
MICRO NUMBER:: 59803
SPECIMEN QUALITY: ADEQUATE
Trichomonas vaginosis: NOT DETECTED

## 2018-09-05 LAB — C. TRACHOMATIS/N. GONORRHOEAE RNA
C. TRACHOMATIS RNA, TMA: DETECTED — AB
N. gonorrhoeae RNA, TMA: DETECTED — AB

## 2018-09-05 MED ORDER — METRONIDAZOLE 500 MG PO TABS: 500.0000 mg | ORAL_TABLET | Freq: Two times a day (BID) | ORAL | 0 refills | Status: DC

## 2018-09-09 ENCOUNTER — Telehealth: Payer: Self-pay | Admitting: Family

## 2018-09-09 NOTE — Telephone Encounter (Signed)
Patient called this afternoon and would like to know her results of testing that was completed on her last visit.

## 2018-09-10 NOTE — Telephone Encounter (Signed)
Called and explained to patient results along with need for partner to be tx. Explained she was treated appropriately in the clinic.

## 2018-10-03 ENCOUNTER — Ambulatory Visit (INDEPENDENT_AMBULATORY_CARE_PROVIDER_SITE_OTHER): Payer: Self-pay | Admitting: Pediatrics

## 2018-10-03 ENCOUNTER — Encounter: Payer: Self-pay | Admitting: Pediatrics

## 2018-10-03 VITALS — BP 116/73 | HR 80 | Ht 63.58 in | Wt 162.6 lb

## 2018-10-03 DIAGNOSIS — Z3046 Encounter for surveillance of implantable subdermal contraceptive: Secondary | ICD-10-CM

## 2018-10-03 DIAGNOSIS — Z113 Encounter for screening for infections with a predominantly sexual mode of transmission: Secondary | ICD-10-CM

## 2018-10-03 DIAGNOSIS — Z3009 Encounter for other general counseling and advice on contraception: Secondary | ICD-10-CM

## 2018-10-03 NOTE — Progress Notes (Signed)

## 2018-10-03 NOTE — Progress Notes (Signed)
History was provided by the patient.  Emily Mcdowell is a 23 y.o. female who is here for nexplanon removal.  Maree Erie, MD   HPI:  Pt reports wishes to have nexplanon removed so she can attempt to conceive. She has been tracking menstrual cycles and does not have questions about this. Recently treated for gonorrhea- no complaints regarding this today. She is taking a PNV.   No LMP recorded. Patient has had an implant.  Review of Systems  Constitutional: Negative for malaise/fatigue.  Eyes: Negative for double vision.  Respiratory: Negative for shortness of breath.   Cardiovascular: Negative for chest pain and palpitations.  Gastrointestinal: Negative for abdominal pain, constipation, diarrhea, nausea and vomiting.  Genitourinary: Negative for dysuria.  Musculoskeletal: Negative for joint pain and myalgias.  Skin: Negative for rash.  Neurological: Negative for dizziness and headaches.  Endo/Heme/Allergies: Does not bruise/bleed easily.    Patient Active Problem List   Diagnosis Date Noted  . Nexplanon in place 01/31/2018  . ALLERGIC RHINITIS 02/28/2005  . Asthma 01/19/2005    Current Outpatient Medications on File Prior to Visit  Medication Sig Dispense Refill  . albuterol (PROVENTIL HFA;VENTOLIN HFA) 108 (90 BASE) MCG/ACT inhaler Inhale 2 puffs into the lungs every 4 (four) hours as needed for wheezing. 1 Inhaler 3  . beclomethasone (QVAR) 80 MCG/ACT inhaler Inhale 1 puff into the lungs 2 (two) times daily. 1 Inhaler 12  . Prenatal Multivit-Min-Fe-FA (PRENATAL VITAMINS) 0.8 MG tablet Take 1 tablet by mouth daily. 30 tablet 2  . metroNIDAZOLE (FLAGYL) 500 MG tablet Take 1 tablet (500 mg total) by mouth 2 (two) times daily. (Patient not taking: Reported on 10/03/2018) 14 tablet 0   No current facility-administered medications on file prior to visit.     No Known Allergies   Physical Exam:    Vitals:   10/03/18 0904  BP: 116/73  Pulse: 80  Weight: 162 lb 9.6 oz  (73.8 kg)  Height: 5' 3.58" (1.615 m)    Growth percentile SmartLinks can only be used for patients less than 75 years old.  Physical Exam Constitutional:      Appearance: She is well-developed.  HENT:     Head: Normocephalic.  Neck:     Thyroid: No thyromegaly.  Cardiovascular:     Rate and Rhythm: Normal rate and regular rhythm.     Heart sounds: Normal heart sounds.  Pulmonary:     Effort: Pulmonary effort is normal.     Breath sounds: Normal breath sounds.  Abdominal:     General: Bowel sounds are normal.     Palpations: Abdomen is soft.     Tenderness: There is no abdominal tenderness.  Musculoskeletal: Normal range of motion.  Skin:    General: Skin is warm and dry.  Neurological:     Mental Status: She is alert and oriented to person, place, and time.     Assessment/Plan: 1. Encounter for Nexplanon removal See procedure note.   2. Family planning counseling Discussed PNV, cycle tracking.   3. Routine screening for STI (sexually transmitted infection) Repeat for TOC.  - C. trachomatis/N. gonorrhoeae RNA

## 2018-10-03 NOTE — Patient Instructions (Signed)
If you get a positive pregnancy test at home, come in and we will confirm here and refer you to the OB's office  Take prenatal vitamins daily.    Your Nexplanon was removed today and is no longer preventing pregnancy.  If you have sex, remember to use condoms to prevent pregnancy and to prevent sexually transmitted infections.  Leave the outside bandage on for 24 hours.  Leave the smaller bandages on for 3-5 days or until they fall off on their own.  Keep the area clean and dry for 3-5 days.  There is usually bruising or swelling at and around the removal site for a few days to a week after the removal.  If you see redness or pus draining from the removal site, call us immediately.  We would like you to return to the clinic for a follow-up visit in 1 month.  You can call [facility] 24 hours a day with any questions or concerns.  There is always a nurse or doctor available to take your call.  Call 9-1-1 if you have a life-threatening emergency.  For anything else, please call us at [phone] before heading to the ER.

## 2018-10-04 LAB — C. TRACHOMATIS/N. GONORRHOEAE RNA
C. TRACHOMATIS RNA, TMA: NOT DETECTED
N. gonorrhoeae RNA, TMA: NOT DETECTED

## 2018-11-27 ENCOUNTER — Ambulatory Visit (INDEPENDENT_AMBULATORY_CARE_PROVIDER_SITE_OTHER): Payer: Self-pay | Admitting: Family

## 2018-11-27 ENCOUNTER — Other Ambulatory Visit: Payer: Self-pay

## 2018-11-27 DIAGNOSIS — Z3202 Encounter for pregnancy test, result negative: Secondary | ICD-10-CM

## 2018-11-27 DIAGNOSIS — Z3169 Encounter for other general counseling and advice on procreation: Secondary | ICD-10-CM

## 2018-11-27 DIAGNOSIS — Z113 Encounter for screening for infections with a predominantly sexual mode of transmission: Secondary | ICD-10-CM

## 2018-11-27 LAB — POCT URINE PREGNANCY: Preg Test, Ur: NEGATIVE

## 2018-11-27 MED ORDER — PRENATAL VITAMIN 27-0.8 MG PO TABS: 1.0000 | ORAL_TABLET | Freq: Every day | ORAL | 1 refills | Status: DC

## 2018-11-28 LAB — WET PREP BY MOLECULAR PROBE
Candida species: NOT DETECTED
MICRO NUMBER:: 383311
SPECIMEN QUALITY:: ADEQUATE
Trichomonas vaginosis: NOT DETECTED

## 2018-12-03 ENCOUNTER — Other Ambulatory Visit: Payer: Self-pay | Admitting: Family

## 2018-12-03 MED ORDER — METRONIDAZOLE 500 MG PO TABS: 500.0000 mg | ORAL_TABLET | Freq: Two times a day (BID) | ORAL | 0 refills | Status: DC

## 2018-12-09 ENCOUNTER — Encounter: Payer: Self-pay | Admitting: Family

## 2018-12-09 NOTE — Progress Notes (Signed)
History was provided by the patient.  Emily Mcdowell is a 23 y.o. female who is here for pregnancy test.   PCP confirmed? Yes.    Maree ErieStanley, Angela J, MD  HPI:   -her nexplanon was removed on 2/12 -no HPT to date -she had one light cycle since removal about 2 weeks ago  -having mild cramping now  -actively trying to conceive Review of Systems  Constitutional: Negative for chills, fever and malaise/fatigue.  HENT: Negative for sore throat.   Eyes: Negative for blurred vision and photophobia.  Respiratory: Negative for cough.   Cardiovascular: Negative for chest pain and palpitations.  Gastrointestinal: Negative for abdominal pain, heartburn, nausea and vomiting.  Genitourinary: Negative for dysuria and frequency.  Skin: Negative for rash.  Neurological: Negative for headaches.  Psychiatric/Behavioral: Negative for suicidal ideas.      Patient Active Problem List   Diagnosis Date Noted  . ALLERGIC RHINITIS 02/28/2005  . Asthma 01/19/2005    Current Outpatient Medications on File Prior to Visit  Medication Sig Dispense Refill  . albuterol (PROVENTIL HFA;VENTOLIN HFA) 108 (90 BASE) MCG/ACT inhaler Inhale 2 puffs into the lungs every 4 (four) hours as needed for wheezing. 1 Inhaler 3  . beclomethasone (QVAR) 80 MCG/ACT inhaler Inhale 1 puff into the lungs 2 (two) times daily. 1 Inhaler 12  . Prenatal Multivit-Min-Fe-FA (PRENATAL VITAMINS) 0.8 MG tablet Take 1 tablet by mouth daily. 30 tablet 2   No current facility-administered medications on file prior to visit.     No Known Allergies  Physical Exam:   There were no vitals filed for this visit.  Growth percentile SmartLinks can only be used for patients less than 22 years old. No LMP recorded. Patient has had an implant.  Physical Exam Vitals signs reviewed.  Constitutional:      Appearance: Normal appearance. She is not diaphoretic.  HENT:     Head: Normocephalic.     Nose: Nose normal.     Mouth/Throat:   Mouth: Mucous membranes are moist.     Pharynx: No oropharyngeal exudate.  Eyes:     General: No scleral icterus.    Extraocular Movements: Extraocular movements intact.     Pupils: Pupils are equal, round, and reactive to light.  Neck:     Musculoskeletal: Normal range of motion.  Cardiovascular:     Rate and Rhythm: Normal rate and regular rhythm.     Heart sounds: No murmur.  Pulmonary:     Effort: Pulmonary effort is normal.  Abdominal:     General: Abdomen is flat.     Palpations: Abdomen is soft.  Genitourinary:    General: Normal vulva.  Musculoskeletal: Normal range of motion.  Lymphadenopathy:     Cervical: No cervical adenopathy.  Skin:    General: Skin is warm and dry.     Capillary Refill: Capillary refill takes less than 2 seconds.     Findings: No rash.  Neurological:     General: No focal deficit present.     Mental Status: She is alert and oriented to person, place, and time.  Psychiatric:        Mood and Affect: Mood normal.        Behavior: Behavior normal.     Assessment/Plan: 1. Encounter for preconception consultation -discussed need for folic acid for prenatal care  -exercise, proper rest and nutrition  - Prenatal Vit-Fe Fumarate-FA (PRENATAL VITAMIN) 27-0.8 MG TABS; Take 1 tablet by mouth daily with breakfast.  Dispense: 90 tablet;  Refill: 1  2. Negative pregnancy test -negative today  -advised that she can rely on HPT as needed to avoid exposure risks in clinic - POCT urine pregnancy  3. Routine screening for STI (sexually transmitted infection) -negative  - WET PREP BY MOLECULAR PROBE

## 2019-05-09 NOTE — Progress Notes (Signed)
A user error has taken place: encounter opened in error, closed for administrative reasons.

## 2019-07-30 ENCOUNTER — Ambulatory Visit (INDEPENDENT_AMBULATORY_CARE_PROVIDER_SITE_OTHER): Payer: Self-pay | Admitting: Family

## 2019-07-30 DIAGNOSIS — Z3169 Encounter for other general counseling and advice on procreation: Secondary | ICD-10-CM

## 2019-07-30 NOTE — Progress Notes (Signed)
THIS RECORD MAY CONTAIN CONFIDENTIAL INFORMATION THAT SHOULD NOT BE RELEASED WITHOUT REVIEW OF THE SERVICE PROVIDER.  Adolescent Medicine Consultation Follow-Up Visit Emily Mcdowell  is a 23 y.o. female referred by Lurlean Leyden, MD here today for follow-up.    HPI:    Patient reports since having nexplanon removed last year she has been spotting every other day. Some days she will bleed heavy and need four pads and will have to wear a panty liner everyday because of spotting. She also reports period cramps starting around the 21st of each month when she believes she is getting her period, which then resolve after a few days. She is currently sexually active and not using protection with her boyfriend. She is not experiencing dyspareunia or any dyschezia. Her only other concern currently is why she is not getting pregnant. She has stopped taking her prenatal vitamins they haven't helped get her pregnant. Rest of ROS is negative.  Outpatient Medications Prior to Visit  Medication Sig Dispense Refill  . albuterol (PROVENTIL HFA;VENTOLIN HFA) 108 (90 BASE) MCG/ACT inhaler Inhale 2 puffs into the lungs every 4 (four) hours as needed for wheezing. 1 Inhaler 3  . beclomethasone (QVAR) 80 MCG/ACT inhaler Inhale 1 puff into the lungs 2 (two) times daily. 1 Inhaler 12  . metroNIDAZOLE (FLAGYL) 500 MG tablet Take 1 tablet (500 mg total) by mouth 2 (two) times daily. 14 tablet 0  . Prenatal Multivit-Min-Fe-FA (PRENATAL VITAMINS) 0.8 MG tablet Take 1 tablet by mouth daily. 30 tablet 2  . Prenatal Vit-Fe Fumarate-FA (PRENATAL VITAMIN) 27-0.8 MG TABS Take 1 tablet by mouth daily with breakfast. 90 tablet 1   No facility-administered medications prior to visit.      Patient Active Problem List   Diagnosis Date Noted  . ALLERGIC RHINITIS 02/28/2005  . Asthma 01/19/2005    Observations: Patient appears well in NAD, happy and friendly.  Assessment/Plan:  Encounter for preconception consultation   -Patient has has had persistent spotting since removal of Nexplanon and reports she is unable to become pregnant as she is actively trying. Daily spotting is concerning for possible STI as she is having unprotected sex and was last treated for Gardnerella in April 2020. Patient may benefit from seeing OB/GYN for fertility concern. In the mean time I will order labs to check endocrine function to see if that is contributing to spotting and fertility issues.I encouraged continued use of prenatal vitamins in the mean time.   - Plan: Hemoglobin A1c, Lipid panel, TSH + free T4, FSH/LH, POCT urine pregnancy, Chlamydia/Gonococcus/Trichomonas, NAA, Testosterone  Follow-up:   -Will follow up once labs are performed this week.  Medical decision-making:  > 15 minutes spent, more than 50% of appointment was spent discussing diagnosis and management of symptoms   Mellody Drown, MD

## 2019-08-01 ENCOUNTER — Other Ambulatory Visit: Payer: Self-pay

## 2019-08-01 ENCOUNTER — Other Ambulatory Visit (HOSPITAL_COMMUNITY)
Admission: RE | Admit: 2019-08-01 | Discharge: 2019-08-01 | Disposition: A | Discharge status: Home / Self Care | Payer: Self-pay | Source: Ambulatory Visit | Attending: Pediatrics | Admitting: Pediatrics

## 2019-08-01 DIAGNOSIS — Z3169 Encounter for other general counseling and advice on procreation: Secondary | ICD-10-CM

## 2019-08-01 DIAGNOSIS — Z113 Encounter for screening for infections with a predominantly sexual mode of transmission: Secondary | ICD-10-CM | POA: Insufficient documentation

## 2019-08-01 NOTE — Addendum Note (Signed)
Addended by: Rejeana Brock on: 08/01/2019 05:03 PM   Modules accepted: Orders

## 2019-08-02 LAB — HEMOGLOBIN A1C
Hgb A1c MFr Bld: 5.3 % of total Hgb (ref <5.7)
Mean Plasma Glucose: 105 (calc)
eAG (mmol/L): 5.8 (calc)

## 2019-08-02 LAB — LIPID PANEL
Cholesterol: 155 mg/dL (ref <200)
HDL: 56 mg/dL (ref >=50)
LDL Cholesterol (Calc): 85 mg/dL (calc)
Non-HDL Cholesterol (Calc): 99 mg/dL (calc) (ref <130)
Total CHOL/HDL Ratio: 2.8 (calc) (ref <5.0)
Triglycerides: 63 mg/dL (ref <150)

## 2019-08-02 LAB — FSH/LH
FSH: 5.4 m[IU]/mL
LH: 4.4 m[IU]/mL

## 2019-08-02 LAB — HCG, QUANTITATIVE, PREGNANCY: HCG, Total, QN: <3 m[IU]/mL

## 2019-08-02 LAB — TSH+FREE T4: TSH W/REFLEX TO FT4: 0.87 mIU/L

## 2019-08-02 LAB — TESTOSTERONE, TOTAL, LC/MS/MS

## 2019-08-04 LAB — URINE CYTOLOGY ANCILLARY ONLY
Chlamydia: POSITIVE — AB
Comment: NEGATIVE
Comment: NEGATIVE
Comment: NORMAL
Neisseria Gonorrhea: NEGATIVE
Trichomonas: POSITIVE — AB

## 2019-08-05 ENCOUNTER — Other Ambulatory Visit: Payer: Self-pay | Admitting: Pediatrics

## 2019-08-05 ENCOUNTER — Telehealth: Payer: Self-pay

## 2019-08-05 DIAGNOSIS — A749 Chlamydial infection, unspecified: Secondary | ICD-10-CM

## 2019-08-05 DIAGNOSIS — A599 Trichomoniasis, unspecified: Secondary | ICD-10-CM

## 2019-08-05 MED ORDER — ONDANSETRON 4 MG PO TBDP
8.0000 mg | ORAL_TABLET | Freq: Once | ORAL | Status: AC
  Administered 2019-08-07: 8 mg via ORAL

## 2019-08-05 MED ORDER — AZITHROMYCIN 500 MG PO TABS
1000.0000 mg | ORAL_TABLET | Freq: Once | ORAL | Status: AC
  Administered 2019-08-07: 1000 mg via ORAL

## 2019-08-05 MED ORDER — METRONIDAZOLE 250 MG PO TABS
2000.0000 mg | ORAL_TABLET | Freq: Once | ORAL | Status: AC
  Administered 2019-08-07: 2000 mg via ORAL

## 2019-08-05 NOTE — Telephone Encounter (Signed)
Patient is requesting results from recent lab work.

## 2019-08-05 NOTE — Telephone Encounter (Signed)
Spoke with patient and relayed positive results for Chlamydia and trich. Pt to abstain from sex until 7 days after tx. Made in office visit for Thursday for treatment. She will eat well to avoid abdominal upset. Pt has no symptoms, pelvic pain, or pain with intercourse.

## 2019-08-06 ENCOUNTER — Telehealth: Payer: Self-pay | Admitting: Pediatrics

## 2019-08-06 NOTE — Telephone Encounter (Signed)

## 2019-08-07 ENCOUNTER — Other Ambulatory Visit: Payer: Self-pay

## 2019-08-07 ENCOUNTER — Ambulatory Visit: Payer: Self-pay

## 2019-08-07 DIAGNOSIS — A749 Chlamydial infection, unspecified: Secondary | ICD-10-CM

## 2019-08-07 DIAGNOSIS — A599 Trichomoniasis, unspecified: Secondary | ICD-10-CM

## 2019-08-07 NOTE — Progress Notes (Signed)
Pt here today for STI treatment. Zofran given prior to administration of Flagyl and Azithromycin to prevent nausea/vomiting. Medication tolerated well. Pt to MyChart message if unable to keep medication down. Appointment made for 1 month test of cure.

## 2019-08-07 NOTE — Patient Instructions (Signed)
Chlamydia, Female   Chlamydia is a STD (sexually transmitted disease). This is an infection that spreads through sexual contact. If it is not treated, it can cause serious problems. It must be treated with antibiotic medicine. If this infection is not treated and you are pregnant or become pregnant, your baby could get it during delivery. This may cause bad health problems for the baby. Sometimes, you may not have symptoms (asymptomatic). When you have symptoms, they can include:  Burning when you pee (urinate).  Peeing often.  Fluid (discharge) coming from the vagina.  Redness, soreness, and swelling (inflammation) of the butt (rectum).  Bleeding or fluid coming from the butt.  Belly (abdominal) pain.  Pain during sex.  Bleeding between periods.  Itching, burning, or redness in the eyes.  Fluid coming from the eyes. Follow these instructions at home: Medicines  Take over-the-counter and prescription medicines only as told by your doctor.  Take your antibiotic medicine as told by your doctor. Do not stop taking the antibiotic even if you start to feel better. Sexual activity  Tell sex partners about your infection. Sex partners are people you had oral, anal, or vaginal sex with within 60 days of when you started getting sick. They need treatment, too.  Do not have sex until: ? You and your sex partners have been treated. ? Your doctor says it is okay.  If you have a single dose treatment, wait 7 days before having sex. General instructions  It is up to you to get your test results. Ask your doctor when your results will be ready.  Get a lot of rest.  Eat healthy foods.  Drink enough fluid to keep your pee (urine) clear or pale yellow.  Keep all follow-up visits as told by your doctor. You may need tests after 3 months. Preventing chlamydia  The only way to prevent chlamydia is not to have sex. To lower your risk: ? Use latex condoms correctly. Do this every  time you have sex. ? Avoid having many sex partners. ? Ask if your partner has been tested for STDs and if he or she had negative results. Contact a doctor if:  You get new symptoms.  You do not get better with treatment.  You have a fever or chills.  You have pain during sex. Get help right away if:  Your pain gets worse and does not get better with medicine.  You get flu-like symptoms, such as: ? Night sweats. ? Sore throat. ? Muscle aches.  You feel sick to your stomach (nauseous).  You throw up (vomit).  You have trouble swallowing.  You have bleeding: ? Between periods. ? After sex.  You have irregular periods.  You have belly pain that does not get better with medicine.  You have lower back pain that does not get better with medicine.  You feel weak or dizzy.  You pass out (faint).  You are pregnant and you get symptoms of chlamydia. Summary  Chlamydia is an infection that spreads through sexual contact.  Sometimes, chlamydia can cause no symptoms (asymptomatic).  Do not have sex until your doctor says it is okay.  All sex partners will have to be treated for chlamydia. This information is not intended to replace advice given to you by your health care provider. Make sure you discuss any questions you have with your health care provider. Document Released: 05/16/2008 Document Revised: 01/29/2018 Document Reviewed: 07/27/2016 Elsevier Patient Education  2020 [employer]  Trichinosis Trichinosis  is a rare infection that is caused by a parasitic worm. People can become infected by eating raw or undercooked meat of an animal that is infected with this worm. The severity of the illness varies depending on the number of worms in the contaminated meat. Trichinosis can range from mild to severe. In mild to moderate cases, most symptoms will go away in a few months. If the worms heavily invade the heart, the lungs, or the brain and spinal cord (nervous  system), the infection can cause serious health problems. What are the causes? Trichinosis is caused by a type of parasitic worm called Trichinella spiralis. In infected meat, immature worms (larvae) are contained within small cysts. When a human eats infected meat, the stomach acid dissolves the cysts, releasing the larvae. The larvae then travel into the intestines where they can mature and lay eggs. Once the eggs hatch, the worms can travel through the arteries into muscles. When worms reach the muscles, a cyst forms around the worms. What increases the risk? You are more likely to develop this condition if you eat certain undercooked meats, including meats of the following animals: pig, wild boar, bear, wild feline (such as cougar), fox, horse, wolf, walrus, seal, or dog. What are the signs or symptoms? Early symptoms occur when the larvae are in the intestine. These symptoms usually occur 1-2 days after eating infected meat, and they may include:  Diarrhea.  Abdominal pain.  Nausea and vomiting.  Fatigue.  Fever. 7-10 days after eating infected meat, you may also develop other symptoms, such as:  Swelling of the face, especially around the eyes.  Muscle aches and pain.  Fatigue.  Weakness.  Headache.  Fever.  Chills.  Shortness of breath.  Coughing.  Itchy skin.  Diarrhea or constipation.  Problems with coordination. How is this diagnosed? This condition may be diagnosed based on:  Your medical history.  Your symptoms.  A physical exam.  Blood tests.  Testing a sample of muscle tissue under a microscope (biopsy). How is this treated?   This condition is treated with medicines that kill any worms that are in your body (antiparasitics). The type of medicine you take and how long you take it depend on how severe your condition is. Your health care provider may also prescribe medicines to relieve swelling and pain (corticosteroids). Follow these  instructions at home:  Rest as much as possible until you feel better. Return to your normal activities as told by your health care provider.  Take over-the-counter and prescription medicines only as told by your health care provider. Do not stop taking your medicine even if you start to feel better.  Drink enough fluid to keep your urine pale yellow.  Eat a healthy diet. Eat small, frequent meals throughout the day while you are recovering.  Keep all follow-up visits as told by your health care provider. This is important. How is this prevented?    Wash your hands with soap and warm water after handling raw meat to avoid spreading the infection.  Do not eat meat that is raw or undercooked. Cooking meat properly will kill any worms in the meat. If possible, use a food thermometer. Different types of meat should be cooked to certain temperatures.  Clean meat grinders thoroughly after use.  Make sure to cook all wild game meat thoroughly, even if it was previously frozen. Freezing may not kill bacteria.  Avoid eating homemade jerky or sausage that was only cured, dried, or smoked. These  cooking techniques do not consistently kill bacteria.  Freeze pork meat less than 6 inches thick for 20 days at 37F or colder. This can kill any bacteria in the pork meat.  Do not allow wild animals or pigs to eat undercooked meat or scraps. This will help prevent the bacteria from spreading. Contact a health care provider if:  Your symptoms get worse.  You have pain that gets worse or does not get better with medicine.  You have a fever. Get help right away if:  You develop a severe headache.  You have a stiff or painful neck.  You have chest pain.  You have an irregular heartbeat.  You have shortness of breath or trouble breathing.  You have trouble coordinating movements.  You experience dizziness or confusion.  You have jerky movements that you cannot control (seizure).  You  have severe nausea and vomiting. Summary  Trichinosis is a rare infection that is caused by a parasitic worm.  People can develop this infection by eating raw or undercooked meat of an animal that is infected with this worm.  The most common sources of this infection include undercooked pig, bear, and wild boar meat.  Early symptoms include abdominal pain, vomiting, diarrhea, fatigue, and fever. Later symptoms can also include headache, muscle weakness, itchy skin, shortness of breath, and problems with coordination.  This condition is treated with medicines that kill any worms that are in your body (antiparasitics). The type of medicine you take and how long you take it depend on how severe your condition is. This information is not intended to replace advice given to you by your health care provider. Make sure you discuss any questions you have with your health care provider. Document Released: 11/13/2000 Document Revised: 09/17/2018 Document Reviewed: 11/15/2016 Elsevier Patient Education  2020 [employer].

## 2019-09-11 ENCOUNTER — Ambulatory Visit: Payer: Self-pay | Admitting: Pediatrics

## 2019-09-15 ENCOUNTER — Ambulatory Visit (INDEPENDENT_AMBULATORY_CARE_PROVIDER_SITE_OTHER): Payer: Self-pay | Admitting: Pediatrics

## 2019-09-15 ENCOUNTER — Encounter: Payer: Self-pay | Admitting: Pediatrics

## 2019-09-15 ENCOUNTER — Other Ambulatory Visit: Payer: Self-pay

## 2019-09-15 VITALS — BP 116/76 | Ht 63.3 in | Wt 176.4 lb

## 2019-09-15 DIAGNOSIS — Z7189 Other specified counseling: Secondary | ICD-10-CM

## 2019-09-15 DIAGNOSIS — Z7187 Encounter for pediatric-to-adult transition counseling: Secondary | ICD-10-CM

## 2019-09-15 NOTE — Progress Notes (Signed)
   Subjective:    Patient ID: Consepcion Mcdowell, female    DOB: April 18, 1996, 24 y.o.   MRN: [id number]  HPI Emily Mcdowell is here today for counseling on transition from pediatric medicine to adult medicine. She has not been seen at this practice for well care since 2016 but has continued to come for gyn management in the adolescent clinic  No illness concerns today.  PHMx: Considers self in good health.  No known allergies. Had asthma when little but not troubled in many years (?middle school). Healthy food choices and takes a daily vitamin supplement. Alcohol maybe once a week; no tobacco or marijuana. Sleeps well - states 8 hours or more nightly. She is trying to conceive. States no mental health concerns outside of mood swings preceding her menstrual period.  Social Hx: Currently lives with sister. Not married and has no kids but has long-term boyfriend, age 21 years. She works [employer] daily. Does not have insurance from job and is no longer on  Medicaid. Drives self and has her own vehicle. Boyfriend works at [employer] and she does not think he has insurance either  Family Hx: noncontributory  Review of Systems  Constitutional: Negative for fever.  HENT: Negative for congestion.   Respiratory: Negative for cough.   Psychiatric/Behavioral: Negative for sleep disturbance.       Objective:   Physical Exam Constitutional:      General: She is not in acute distress.    Appearance: Normal appearance.  Cardiovascular:     Rate and Rhythm: Normal rate.     Pulses: Normal pulses.     Heart sounds: Normal heart sounds. No murmur.  Pulmonary:     Effort: Pulmonary effort is normal.     Breath sounds: Normal breath sounds.  Neurological:     Mental Status: She is alert.   Blood pressure 116/76, height 5' 3.3" (1.608 m), weight 176 lb 6.4 oz (80 kg).    Assessment & Plan:   1. Counseling for transition from pediatric to adult care provider   Emily Mcdowell completed the [facility]  Adult Transition Readiness form and the mental health screen associated with this.  She had no problems identified; issues identified were about insurance and location of a medical practice. Discussed available medical practices and financial counseling.  Explained benefit of meeting with financial counselor to establish coverage for visits. Will proceed with transition to [facility], unless her family members intervene with another preferred practice. Orders Placed This Encounter  Procedures  . Ambulatory referral to [facility]   Greater than 50% of this 20 minute face to face encounter spent in counseling for presenting issues. Maree Erie, MD

## 2019-09-15 NOTE — Patient Instructions (Addendum)
You will get a call about financial counseling; this will help you get insurance coverage for visits with cost of care.  Below are several practices for adult care in our community. Based on our conversation [facility] sounds like a good match for your health needs and also for your boyfriend.  Adult Primary Care Clinics Name Criteria Services   [facility]  Address: [address] [place], [place] [address]  Phone: [phone] Hours: Monday - Friday 9 AM -6 PM  Types of insurance accepted:  Marland Kitchen Nurse, learning disability . [facility] (orange card) . Medicaid . Medicare . Uninsured  Language services:  Marland Kitchen Video and phone interpreters available   Ages 77 and older    . Adult primary care . Onsite pharmacy . Integrated behavioral health . Financial assistance counseling . Walk-in hours for established patients  Financial assistance counseling hours: Tuesdays 2:00PM - 5:00PM  Thursday 8:30AM - 4:30PM  Space is limited, 10 on Tuesday and 20 on Thursday. It's on first come first serve basis  Name Criteria Services   [facility]  Address: [address] [place], [place] [address]  Phone: [phone]  Hours: Monday - Friday 8:30 AM - 5 PM  Types of insurance accepted:  Marland Kitchen Nurse, learning disability . Medicaid . Medicare . Uninsured  Language services:  Marland Kitchen Video and phone interpreters available   All ages - newborn to adult   . Primary care for all ages (children and adults) . Integrated behavioral health . Nutritionist . Financial assistance counseling   Name Criteria Services    [facility]  Located on the ground floor of [facility]  Address: [address]  [place],  [place]  [address]  Phone: [phone]  Hours: Monday - Friday 8:15 AM - 5 PM  Types of insurance accepted:  Marland Kitchen Camera operator . Medicaid . Medicare . Uninsured  Language services:  Marland Kitchen Video and phone interpreters available   Ages 70 and older   . Adult primary care . Nutritionist . Certified Diabetes Educator  . Integrated behavioral health . Financial assistance counseling   Name Criteria Services   [facility]  Address: [address] [place], [place] [address]  Phone: [phone]  Hours: Monday - Friday 8:30 AM - 5 PM    Types of insurance accepted:  Marland Kitchen Nurse, learning disability . Medicaid . Medicare . Uninsured  Language services:  Marland Kitchen Video and phone interpreters available   All ages - newborn to adult   . Primary care for all ages (children and adults) . Integrated behavioral health . Financial assistance counseling

## 2019-09-16 ENCOUNTER — Ambulatory Visit: Payer: Self-pay

## 2019-10-06 ENCOUNTER — Ambulatory Visit: Payer: Self-pay

## 2019-11-21 ENCOUNTER — Ambulatory Visit: Payer: Self-pay | Admitting: Student in an Organized Health Care Education/Training Program

## 2020-01-08 ENCOUNTER — Encounter: Payer: Self-pay | Admitting: Pediatrics

## 2020-03-01 IMAGING — DX DG ABDOMEN 1V
1 series · 1 of 1 positions shown · non-contrast
Comparison: None.

CLINICAL DATA: Abdominal pain and nausea

EXAM:
ABDOMEN - 1 VIEW

[abdomen kub]
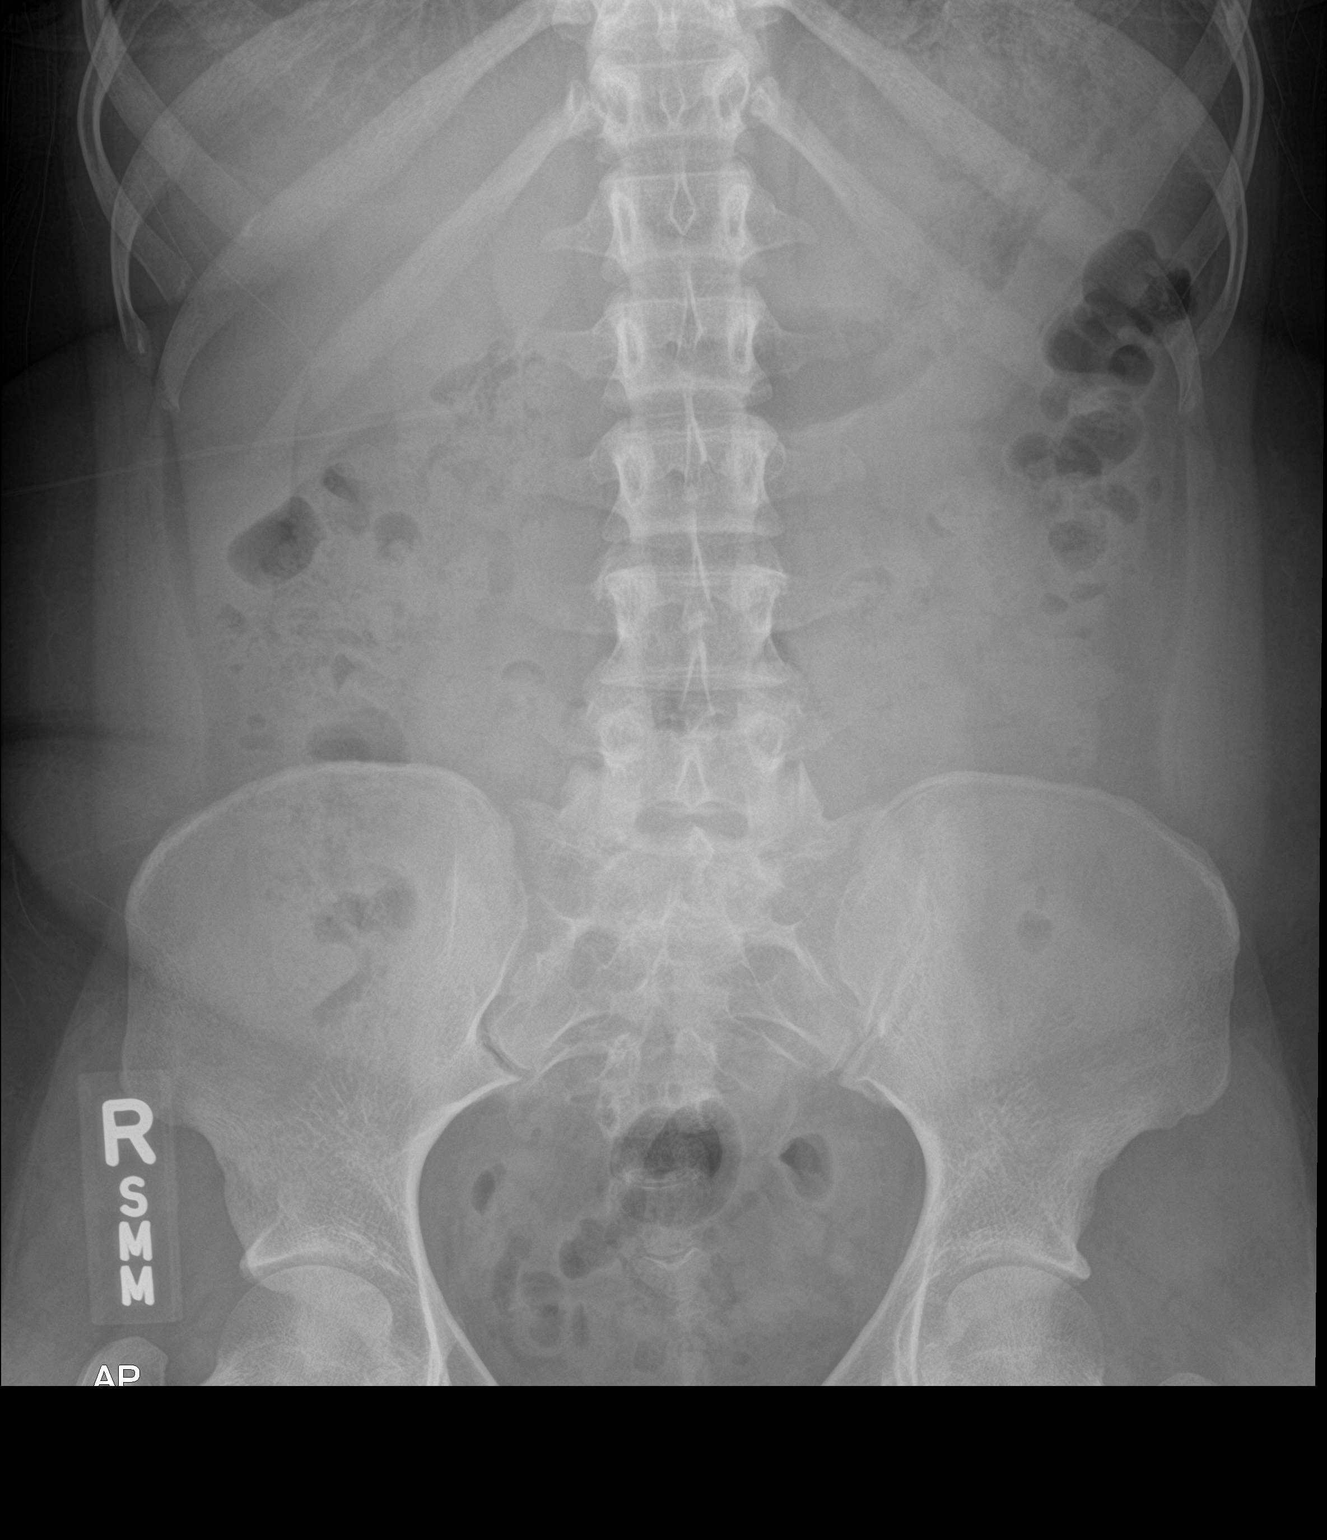

[1 of 1 positions shown; findings below may reference images not displayed]

FINDINGS: There is diffuse stool throughout much of the colon. There is no
bowel dilatation or air-fluid level to suggest bowel obstruction. No
free air.
IMPRESSION: Diffuse stool throughout much of the colon. Suspect a degree of
constipation. No bowel obstruction or free air.

## 2020-04-12 ENCOUNTER — Other Ambulatory Visit: Payer: Self-pay

## 2020-04-12 ENCOUNTER — Emergency Department (HOSPITAL_COMMUNITY)
Admission: EM | Admit: 2020-04-12 | Discharge: 2020-04-13 | Disposition: A | Discharge status: Home / Self Care | Payer: Self-pay | Attending: Emergency Medicine | Admitting: Emergency Medicine

## 2020-04-12 ENCOUNTER — Encounter (HOSPITAL_COMMUNITY): Payer: Self-pay

## 2020-04-12 DIAGNOSIS — Z5321 Procedure and treatment not carried out due to patient leaving prior to being seen by health care provider: Secondary | ICD-10-CM | POA: Insufficient documentation

## 2020-04-12 LAB — COMPREHENSIVE METABOLIC PANEL
ALT: 26 U/L (ref 0–44)
AST: 24 U/L (ref 15–41)
Albumin: 4.2 g/dL (ref 3.5–5.0)
Alkaline Phosphatase: 59 U/L (ref 38–126)
Anion gap: 11 (ref 5–15)
BUN: 13 mg/dL (ref 6–20)
CO2: 25 mmol/L (ref 22–32)
Calcium: 9.2 mg/dL (ref 8.9–10.3)
Chloride: 101 mmol/L (ref 98–111)
Creatinine, Ser: 0.79 mg/dL (ref 0.44–1.00)
GFR calc Af Amer: >60 mL/min (ref >60)
GFR calc non Af Amer: >60 mL/min (ref >60)
Glucose, Bld: 99 mg/dL (ref 70–99)
Potassium: 4.2 mmol/L (ref 3.5–5.1)
Sodium: 137 mmol/L (ref 135–145)
Total Bilirubin: 0.7 mg/dL (ref 0.3–1.2)
Total Protein: 7.4 g/dL (ref 6.5–8.1)

## 2020-04-12 LAB — URINALYSIS, MICROSCOPIC (REFLEX): RBC / HPF: >50 RBC/hpf (ref 0–5)

## 2020-04-12 LAB — URINALYSIS, ROUTINE W REFLEX MICROSCOPIC

## 2020-04-12 LAB — CBC
HCT: 43.3 % (ref 36.0–46.0)
Hemoglobin: 14.2 g/dL (ref 12.0–15.0)
MCH: 30.3 pg (ref 26.0–34.0)
MCHC: 32.8 g/dL (ref 30.0–36.0)
MCV: 92.5 fL (ref 80.0–100.0)
Platelets: 329 10*3/uL (ref 150–400)
RBC: 4.68 MIL/uL (ref 3.87–5.11)
RDW: 12.6 % (ref 11.5–15.5)
WBC: 6.6 10*3/uL (ref 4.0–10.5)
nRBC: 0 % (ref 0.0–0.2)

## 2020-04-12 LAB — LIPASE, BLOOD: Lipase: 25 U/L (ref 11–51)

## 2020-04-12 LAB — I-STAT BETA HCG BLOOD, ED (MC, WL, AP ONLY): I-stat hCG, quantitative: <5 m[IU]/mL (ref <5)
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
## 2020-04-12 NOTE — ED Notes (Signed)
Short stay RN called pt x2 for vitals, no response.

## 2020-04-12 NOTE — ED Notes (Signed)
Called pt x3 for vitals, no response. 

## 2022-04-02 ENCOUNTER — Other Ambulatory Visit: Payer: Self-pay

## 2022-04-02 ENCOUNTER — Encounter (HOSPITAL_COMMUNITY): Payer: Self-pay | Admitting: Emergency Medicine

## 2022-04-02 ENCOUNTER — Emergency Department (HOSPITAL_COMMUNITY)
Admission: EM | Admit: 2022-04-02 | Discharge: 2022-04-02 | Disposition: A | Discharge status: Home / Self Care | Payer: Self-pay | Attending: Emergency Medicine | Admitting: Emergency Medicine

## 2022-04-02 DIAGNOSIS — R1013 Epigastric pain: Secondary | ICD-10-CM | POA: Insufficient documentation

## 2022-04-02 DIAGNOSIS — R112 Nausea with vomiting, unspecified: Secondary | ICD-10-CM | POA: Insufficient documentation

## 2022-04-02 LAB — COMPREHENSIVE METABOLIC PANEL
ALT: 13 U/L (ref 0–44)
AST: 17 U/L (ref 15–41)
Albumin: 4.1 g/dL (ref 3.5–5.0)
Alkaline Phosphatase: 64 U/L (ref 38–126)
Anion gap: 8 (ref 5–15)
BUN: 10 mg/dL (ref 6–20)
CO2: 25 mmol/L (ref 22–32)
Calcium: 9.7 mg/dL (ref 8.9–10.3)
Chloride: 105 mmol/L (ref 98–111)
Creatinine, Ser: 0.87 mg/dL (ref 0.44–1.00)
GFR, Estimated: >60 mL/min (ref >60)
Glucose, Bld: 102 mg/dL — ABNORMAL HIGH (ref 70–99)
Potassium: 3.9 mmol/L (ref 3.5–5.1)
Sodium: 138 mmol/L (ref 135–145)
Total Bilirubin: 0.6 mg/dL (ref 0.3–1.2)
Total Protein: 7.2 g/dL (ref 6.5–8.1)

## 2022-04-02 LAB — URINALYSIS, ROUTINE W REFLEX MICROSCOPIC
Bilirubin Urine: NEGATIVE
Glucose, UA: NEGATIVE mg/dL
Hgb urine dipstick: NEGATIVE
Ketones, ur: NEGATIVE mg/dL
Leukocytes,Ua: NEGATIVE
Nitrite: NEGATIVE
Protein, ur: NEGATIVE mg/dL
Specific Gravity, Urine: 1.019 (ref 1.005–1.030)
pH: 6 (ref 5.0–8.0)

## 2022-04-02 LAB — CBC
HCT: 41.2 % (ref 36.0–46.0)
Hemoglobin: 13.8 g/dL (ref 12.0–15.0)
MCH: 30.7 pg (ref 26.0–34.0)
MCHC: 33.5 g/dL (ref 30.0–36.0)
MCV: 91.6 fL (ref 80.0–100.0)
Platelets: 334 10*3/uL (ref 150–400)
RBC: 4.5 MIL/uL (ref 3.87–5.11)
RDW: 12.7 % (ref 11.5–15.5)
WBC: 8.3 10*3/uL (ref 4.0–10.5)
nRBC: 0 % (ref 0.0–0.2)

## 2022-04-02 LAB — I-STAT BETA HCG BLOOD, ED (MC, WL, AP ONLY): I-stat hCG, quantitative: <5 m[IU]/mL (ref <5)

## 2022-04-02 LAB — LIPASE, BLOOD: Lipase: 28 U/L (ref 11–51)

## 2022-04-02 MED ORDER — ONDANSETRON 4 MG PO TBDP
8.0000 mg | ORAL_TABLET | Freq: Once | ORAL | Status: AC
  Administered 2022-04-02: 8 mg via ORAL
  Filled 2022-04-02: qty 2

## 2022-04-02 MED ORDER — ALUM & MAG HYDROXIDE-SIMETH 200-200-20 MG/5ML PO SUSP
30.0000 mL | Freq: Once | ORAL | Status: AC
  Administered 2022-04-02: 30 mL via ORAL
  Filled 2022-04-02: qty 30

## 2022-04-02 MED ORDER — FAMOTIDINE 20 MG PO TABS
20.0000 mg | ORAL_TABLET | Freq: Once | ORAL | Status: AC
  Administered 2022-04-02: 20 mg via ORAL
  Filled 2022-04-02: qty 1

## 2022-04-02 MED ORDER — TRAMADOL HCL 50 MG PO TABS
50.0000 mg | ORAL_TABLET | Freq: Once | ORAL | Status: AC
  Administered 2022-04-02: 50 mg via ORAL
  Filled 2022-04-02: qty 1

## 2022-04-02 NOTE — Discharge Instructions (Addendum)
It was our pleasure to provide your ER care today - we hope that you feel better.  Drink plenty of fluids/stay well hydrated. .  Follow up with primary care doctor tomorrow if symptoms fail to improve/resolve.  Return to ER right away if worse, new symptoms, fevers, new, worsening or severe abdominal pain, persistent vomiting, or other concern.   You were given pain medication in the ER - no driving for the next 6 hours.

## 2022-04-02 NOTE — ED Notes (Signed)
Patient tolerated oral intake well 

## 2022-04-02 NOTE — ED Notes (Signed)
Pt left without receiving discharge papers.

## 2022-04-02 NOTE — ED Provider Notes (Signed)
[facility] EMERGENCY DEPARTMENT Provider Note   CSN: [id number] Arrival date & time: 04/02/22  1826     History  Chief Complaint  Patient presents with   Abdominal Pain    Emily Mcdowell is a 26 y.o. female.  Patient with c/o epigastric pain and episode nausea/vomiting. Symptoms acute onset in past couple days moderate, persistent, dull, non radiating, without specific exacerbating or alleviating factors. No anorexia.  No bloody or bilious emesis. No abd distension. No prior abd surgery. Had bm today. No dysuria or gu c/o. No fever or chills. No trauma/fall. No chest pain or discomfort. No sob. No radiation of pain or back pain. Having normal periods, no vaginal discharge or bleeding.   The history is provided by the patient and medical records.  Abdominal Pain Associated symptoms: nausea and vomiting   Associated symptoms: no chest pain, no chills, no constipation, no cough, no dysuria, no fever, no shortness of breath, no sore throat, no vaginal bleeding and no vaginal discharge        Home Medications Prior to Admission medications   Medication Sig Start Date End Date Taking? Authorizing Provider  Prenatal Multivit-Min-Fe-FA (PRENATAL VITAMINS) 0.8 MG tablet Take 1 tablet by mouth daily. 09/04/18   Georges Mouse, NP      Allergies    Patient has no known allergies.    Review of Systems   Review of Systems  Constitutional:  Negative for chills and fever.  HENT:  Negative for sore throat.   Eyes:  Negative for redness.  Respiratory:  Negative for cough and shortness of breath.   Cardiovascular:  Negative for chest pain.  Gastrointestinal:  Positive for abdominal pain, nausea and vomiting. Negative for blood in stool and constipation.  Genitourinary:  Negative for dysuria, flank pain, vaginal bleeding and vaginal discharge.  Musculoskeletal:  Negative for back pain and neck pain.  Skin:  Negative for rash.  Neurological:  Negative for headaches.   Hematological:  Does not bruise/bleed easily.  Psychiatric/Behavioral:  Negative for confusion.     Physical Exam Updated Vital Signs BP 118/83 (BP Location: Right Arm)   Pulse 74   Temp 97.8 F (36.6 C) (Oral)   Resp 16   SpO2 99%  Physical Exam Vitals and nursing note reviewed.  Constitutional:      Appearance: Normal appearance. She is well-developed.  HENT:     Head: Atraumatic.     Nose: Nose normal.     Mouth/Throat:     Mouth: Mucous membranes are moist.  Eyes:     General: No scleral icterus.    Conjunctiva/sclera: Conjunctivae normal.  Neck:     Trachea: No tracheal deviation.  Cardiovascular:     Rate and Rhythm: Normal rate and regular rhythm.     Pulses: Normal pulses.     Heart sounds: Normal heart sounds. No murmur heard.    No friction rub. No gallop.  Pulmonary:     Effort: Pulmonary effort is normal. No respiratory distress.     Breath sounds: Normal breath sounds.  Abdominal:     General: Bowel sounds are normal. There is no distension.     Palpations: Abdomen is soft. There is no mass.     Tenderness: There is abdominal tenderness. There is no guarding or rebound.     Hernia: No hernia is present.     Comments: Mild epigastric tenderness.   Genitourinary:    Comments: No cva tenderness.  Musculoskeletal:  General: No swelling.     Cervical back: Normal range of motion and neck supple. No rigidity. No muscular tenderness.  Skin:    General: Skin is warm and dry.     Findings: No rash.  Neurological:     Mental Status: She is alert.     Comments: Alert, speech normal.   Psychiatric:        Mood and Affect: Mood normal.     ED Results / Procedures / Treatments   Labs (all labs ordered are listed, but only abnormal results are displayed) Results for orders placed or performed during the hospital encounter of 04/02/22  Lipase, blood  Result Value Ref Range   Lipase 28 11 - 51 U/L  Comprehensive metabolic panel  Result Value Ref  Range   Sodium 138 135 - 145 mmol/L   Potassium 3.9 3.5 - 5.1 mmol/L   Chloride 105 98 - 111 mmol/L   CO2 25 22 - 32 mmol/L   Glucose, Bld 102 (H) 70 - 99 mg/dL   BUN 10 6 - 20 mg/dL   Creatinine, Ser 6.28 0.44 - 1.00 mg/dL   Calcium 9.7 8.9 - 31.5 mg/dL   Total Protein 7.2 6.5 - 8.1 g/dL   Albumin 4.1 3.5 - 5.0 g/dL   AST 17 15 - 41 U/L   ALT 13 0 - 44 U/L   Alkaline Phosphatase 64 38 - 126 U/L   Total Bilirubin 0.6 0.3 - 1.2 mg/dL   GFR, Estimated >17 >61 mL/min   Anion gap 8 5 - 15  CBC  Result Value Ref Range   WBC 8.3 4.0 - 10.5 K/uL   RBC 4.50 3.87 - 5.11 MIL/uL   Hemoglobin 13.8 12.0 - 15.0 g/dL   HCT 60.7 37.1 - 06.2 %   MCV 91.6 80.0 - 100.0 fL   MCH 30.7 26.0 - 34.0 pg   MCHC 33.5 30.0 - 36.0 g/dL   RDW 69.4 85.4 - 62.7 %   Platelets 334 150 - 400 K/uL   nRBC 0.0 0.0 - 0.2 %  Urinalysis, Routine w reflex microscopic Urine, Clean Catch  Result Value Ref Range   Color, Urine YELLOW YELLOW   APPearance HAZY (A) CLEAR   Specific Gravity, Urine 1.019 1.005 - 1.030   pH 6.0 5.0 - 8.0   Glucose, UA NEGATIVE NEGATIVE mg/dL   Hgb urine dipstick NEGATIVE NEGATIVE   Bilirubin Urine NEGATIVE NEGATIVE   Ketones, ur NEGATIVE NEGATIVE mg/dL   Protein, ur NEGATIVE NEGATIVE mg/dL   Nitrite NEGATIVE NEGATIVE   Leukocytes,Ua NEGATIVE NEGATIVE  I-Stat beta hCG blood, ED  Result Value Ref Range   I-stat hCG, quantitative <5.0 <5 mIU/mL   Comment 3              EKG None  Radiology No results found.  Procedures Procedures    Medications Ordered in ED Medications  famotidine (PEPCID) tablet 20 mg (has no administration in time range)  alum & mag hydroxide-simeth (MAALOX/MYLANTA) 200-200-20 MG/5ML suspension 30 mL (has no administration in time range)  traMADol (ULTRAM) tablet 50 mg (has no administration in time range)  ondansetron (ZOFRAN-ODT) disintegrating tablet 8 mg (has no administration in time range)    ED Course/ Medical Decision Making/ A&P                            Medical Decision Making Problems Addressed: Epigastric pain: acute illness or injury with systemic symptoms that  poses a threat to life or bodily functions Nausea and vomiting in adult: acute illness or injury with systemic symptoms that poses a threat to life or bodily functions  Amount and/or Complexity of Data Reviewed Independent Historian:     Details: pt friend/fam, hx External Data Reviewed: notes. Labs: ordered. Decision-making details documented in ED Course.  Risk OTC drugs. Prescription drug management. Decision regarding hospitalization.   Iv ns. Continuous pulse ox and cardiac monitoring. Labs ordered/sent.   Diff dx includes gastroenteritis, gastritis, mild food poisoning, CHS, etc - dispo decision including potential need for admission considered if intractable nv, aki, significant electrolyte abn. - will get labs and imaging and reassess.   Reviewed nursing notes and prior charts for additional history. External reports reviewed.   Cardiac monitor: sinus rhythm, rate 74.  Labs reviewed/interpreted by me - wbc normal. Preg neg.   Zofran po, pepcid po, maalox po, ultram po.   Po fluids/challenge.   Pt tolerating po, no recurrent nausea/vomiting. Pt indicates pain/vomiting resolved, requests d/c.   Abd soft non tender.   Pt currently appears stable for d/c.  Return precautions provided.              Final Clinical Impression(s) / ED Diagnoses Final diagnoses:  None    Rx / DC Orders ED Discharge Orders     None         Cathren Laine, MD 04/02/22 2142

## 2022-04-02 NOTE — ED Triage Notes (Signed)
Patient complains of abdominal cramping that started a few days ago. Patient also complains of emesis, denies diarrhea.

## 2023-02-26 ENCOUNTER — Ambulatory Visit (HOSPITAL_COMMUNITY): Payer: Self-pay

## 2023-02-28 ENCOUNTER — Ambulatory Visit (HOSPITAL_COMMUNITY)
Admission: RE | Admit: 2023-02-28 | Discharge: 2023-02-28 | Disposition: A | Discharge status: Home / Self Care | Payer: Self-pay | Source: Ambulatory Visit | Attending: Family Medicine | Admitting: Family Medicine

## 2023-02-28 ENCOUNTER — Encounter (HOSPITAL_COMMUNITY): Payer: Self-pay

## 2023-02-28 VITALS — BP 93/49 | HR 91 | Temp 99.8°F | Resp 14

## 2023-02-28 DIAGNOSIS — Z113 Encounter for screening for infections with a predominantly sexual mode of transmission: Secondary | ICD-10-CM

## 2023-02-28 NOTE — ED Triage Notes (Signed)
Pt requesting STD testing. Reports received treatment while back for chlamydia and hasn't been retested. Denies exposure. Reports has intermittent vaginal discharge.

## 2023-02-28 NOTE — ED Provider Notes (Signed)
  [facility]   [id number] 02/28/23 Arrival Time: 1309  ASSESSMENT & PLAN:  1. Screening for STDs (sexually transmitted diseases)       Discharge Instructions      We have sent testing for sexually transmitted infections. We will notify you of any positive results once they are received. If required, we will prescribe any medications you might need.  Please refrain from all sexual activity for at least the next seven days.     Declines empiric tx. Without s/s of PID.  Labs Reviewed  CERVICOVAGINAL ANCILLARY ONLY    Will notify of any positive results. Instructed to refrain from sexual activity for at least seven days.  Reviewed expectations re: course of current medical issues. Questions answered. Outlined signs and symptoms indicating need for more acute intervention. Patient verbalized understanding. After Visit Summary given.   SUBJECTIVE:  Emily Mcdowell is a 27 y.o. female who is requesting STD testing. Reports received treatment while back for chlamydia and hasn't been retested. Denies exposure. Reports has intermittent vaginal discharge but has improved.    Patient's last menstrual period was 02/26/2023.   OBJECTIVE:  Vitals:   02/28/23 1326  BP: (!) 93/49  Pulse: 91  Resp: 14  Temp: 99.8 F (37.7 C)  TempSrc: Oral  SpO2: 98%     General appearance: alert, cooperative, appears stated age and no distress GU: deferred Skin: warm and dry Psychological: alert and cooperative; normal mood and affect.    Labs Reviewed  CERVICOVAGINAL ANCILLARY ONLY    No Known Allergies  Past Medical History:  Diagnosis Date   Asthma    No family history on file. Social History   Socioeconomic History   Marital status: Single    Spouse name: Not on file   Number of children: Not on file   Years of education: Not on file   Highest education level: Not on file  Occupational History   Not on file  Tobacco Use   Smoking status: Some Days    Smokeless tobacco: Never  Substance and Sexual Activity   Alcohol use: Yes   Drug use: Yes    Types: Marijuana   Sexual activity: Yes    Partners: Male    Birth control/protection: Injection, Condom  Other Topics Concern   Not on file  Social History Narrative   Not on file   Social Determinants of Health   Financial Resource Strain: Not on file  Food Insecurity: Not on file  Transportation Needs: Not on file  Physical Activity: Not on file  Stress: Not on file  Social Connections: Not on file  Intimate Partner Violence: Not on file           Mardella Layman, MD 02/28/23 1452

## 2023-02-28 NOTE — Discharge Instructions (Addendum)
We have sent testing for sexually transmitted infections. We will notify you of any positive results once they are received. If required, we will prescribe any medications you might need.  Please refrain from all sexual activity for at least the next seven days.  

## 2023-03-01 LAB — CERVICOVAGINAL ANCILLARY ONLY
Comment: NEGATIVE
Comment: NEGATIVE
Comment: NEGATIVE
Comment: NEGATIVE
Comment: NEGATIVE
Comment: NORMAL

## 2023-03-02 ENCOUNTER — Telehealth (HOSPITAL_COMMUNITY): Payer: Self-pay | Admitting: Emergency Medicine

## 2023-03-02 NOTE — Telephone Encounter (Signed)
Patient will need to return for recollect of cyto swab Results seen on mychart Attempted to reach patient x 1, LVM

## 2023-03-03 ENCOUNTER — Ambulatory Visit (HOSPITAL_COMMUNITY): Payer: Medicaid Other

## 2023-06-19 ENCOUNTER — Encounter (HOSPITAL_COMMUNITY): Payer: Self-pay

## 2023-06-19 ENCOUNTER — Ambulatory Visit (HOSPITAL_COMMUNITY)
Admission: EM | Admit: 2023-06-19 | Discharge: 2023-06-19 | Disposition: A | Discharge status: Home / Self Care | Payer: 59 | Attending: Internal Medicine | Admitting: Internal Medicine

## 2023-06-19 DIAGNOSIS — N938 Other specified abnormal uterine and vaginal bleeding: Secondary | ICD-10-CM | POA: Diagnosis not present

## 2023-06-19 LAB — POCT URINE PREGNANCY: Preg Test, Ur: NEGATIVE

## 2023-06-19 LAB — CBC
HCT: 38.9 % (ref 36.0–46.0)
Hemoglobin: 12.8 g/dL (ref 12.0–15.0)
MCH: 29.1 pg (ref 26.0–34.0)
MCHC: 32.9 g/dL (ref 30.0–36.0)
MCV: 88.4 fL (ref 80.0–100.0)
Platelets: 355 10*3/uL (ref 150–400)
RBC: 4.4 MIL/uL (ref 3.87–5.11)
RDW: 12.7 % (ref 11.5–15.5)
WBC: 14.6 10*3/uL — ABNORMAL HIGH (ref 4.0–10.5)
nRBC: 0 % (ref 0.0–0.2)

## 2023-06-19 MED ORDER — NAPROXEN 500 MG PO TABS: 500.0000 mg | ORAL_TABLET | Freq: Two times a day (BID) | ORAL | 0 refills | Status: AC

## 2023-06-19 NOTE — Discharge Instructions (Addendum)
Follow up with a gynecologist.

## 2023-06-19 NOTE — ED Provider Notes (Signed)
[facility]    CSN: [id number] Arrival date & time: 06/19/23  1006      History   Chief Complaint Chief Complaint  Patient presents with   Abdominal Pain    HPI Emily Mcdowell is a 27 y.o. female.    Abdominal Pain Associated symptoms: vaginal bleeding   Associated symptoms: no chills, no dysuria, no fatigue and no fever   Pelvic pain, currently on her period x 2 weeks, has a history of irregular periods.  Normally has menstrual cramps at the onset of her period but this time pain is persistent and increasing.  Pain is suprapubic, constant, unrelieved with heat.  Admits history of frequent irregular periods  Past Medical History:  Diagnosis Date   Asthma     Patient Active Problem List   Diagnosis Date Noted   Allergic rhinitis 02/28/2005   Asthma 01/19/2005    History reviewed. No pertinent surgical history.  OB History   No obstetric history on file.      Home Medications    Prior to Admission medications   Not on File    Family History Family History  Problem Relation Age of Onset   Cancer Mother     Social History Social History   Tobacco Use   Smoking status: Never   Smokeless tobacco: Never  Vaping Use   Vaping status: Never Used  Substance Use Topics   Alcohol use: Yes   Drug use: Yes    Types: Marijuana     Allergies   Patient has no known allergies.   Review of Systems Review of Systems  Constitutional:  Negative for chills, fatigue and fever.  Gastrointestinal:  Positive for abdominal pain.  Genitourinary:  Positive for menstrual problem, pelvic pain and vaginal bleeding. Negative for dysuria, flank pain, frequency and urgency.  Neurological:  Negative for dizziness and headaches.     Physical Exam Triage Vital Signs ED Triage Vitals [06/19/23 1013]  Encounter Vitals Group     BP 108/65     Systolic BP Percentile      Diastolic BP Percentile      Pulse Rate 83     Resp 14     Temp 97.6 F (36.4 C)      Temp Source Oral     SpO2 99 %     Weight      Height      Head Circumference      Peak Flow      Pain Score 10     Pain Loc      Pain Education      Exclude from Growth Chart    No data found.  Updated Vital Signs BP 108/65 (BP Location: Left Arm)   Pulse 83   Temp 97.6 F (36.4 C) (Oral)   Resp 14   LMP 06/05/2023   SpO2 99%   Visual Acuity Right Eye Distance:   Left Eye Distance:   Bilateral Distance:    Right Eye Near:   Left Eye Near:    Bilateral Near:     Physical Exam Vitals reviewed.  Constitutional:      Appearance: She is not ill-appearing.  HENT:     Head: Normocephalic.  Cardiovascular:     Rate and Rhythm: Normal rate and regular rhythm.  Pulmonary:     Effort: Pulmonary effort is normal.  Abdominal:     General: Abdomen is flat. Bowel sounds are normal. There is no distension. There are no signs  of injury.  Neurological:     Mental Status: She is alert.     UC Treatments / Results  Labs (all labs ordered are listed, but only abnormal results are displayed) Labs Reviewed  CBC  POCT URINE PREGNANCY    EKG   Radiology No results found.  Procedures Procedures (including critical care time)  Medications Ordered in UC Medications - No data to display  Initial Impression / Assessment and Plan / UC Course  I have reviewed the triage vital signs and the nursing notes.  Pertinent labs & imaging results that were available during my care of the patient were reviewed by me and considered in my medical decision making (see chart for details).    27 year old female with a history of irregular periods heavy menstrual bleeding for 2 weeks associated with increased pelvic cramping.  She is well-appearing, vital signs are stable, abdominal exam is benign.  Discussed with patient she needs to follow-up with GYN.  Will check point-of-care pregnancy test and send CBC to look for anemia.  She was given list of GYN offices to schedule an appointment,  ED precautions reviewed with patient, Rx Naprosyn for pain Final Clinical Impressions(s) / UC Diagnoses   Final diagnoses:  None   Discharge Instructions   None    ED Prescriptions   None    PDMP not reviewed this encounter.   Meliton Rattan, [place] 06/19/23 1147

## 2023-06-19 NOTE — ED Triage Notes (Signed)
Patient reports that she has had a period x 2 weeks and at times has had clots. Patient also reports lower abdominal pain x 4 days and states, "It's not my usual abdominal cramping that I get with my periods."

## 2023-06-26 ENCOUNTER — Ambulatory Visit: Payer: 59 | Admitting: Family Medicine

## 2023-06-29 ENCOUNTER — Ambulatory Visit: Payer: 59 | Admitting: Family Medicine

## 2024-04-26 ENCOUNTER — Ambulatory Visit (HOSPITAL_COMMUNITY)
Admission: EM | Admit: 2024-04-26 | Discharge: 2024-04-26 | Disposition: A | Discharge status: Home / Self Care | Attending: Family Medicine | Admitting: Family Medicine

## 2024-04-26 ENCOUNTER — Encounter (HOSPITAL_COMMUNITY): Payer: Self-pay | Admitting: Emergency Medicine

## 2024-04-26 ENCOUNTER — Other Ambulatory Visit: Payer: Self-pay

## 2024-04-26 DIAGNOSIS — T162XXA Foreign body in left ear, initial encounter: Secondary | ICD-10-CM

## 2024-04-26 NOTE — ED Triage Notes (Signed)
 Patient reports using a q-tip in left ear last night and says there is cotton in this ear  Patient has not tried anything to remove cotton

## 2024-04-26 NOTE — ED Provider Notes (Signed)
 [facility]   [id number] 04/26/24 Arrival Time: 1012  ASSESSMENT & PLAN:  1. Acute foreign body of ear canal, left, initial encounter    Easily removed with alligator forceps. No complications. No signs of infection.  Follow-up Information     [facility].   Specialty: Urgent Care Why: As needed. Contact information: [address] [place]  [address] [phone]                Follow-up Information     [facility].   Specialty: Urgent Care Why: As needed. Contact information: [address] [place]  [address] [phone]                Reviewed expectations re: course of current medical issues. Questions answered. Outlined signs and symptoms indicating need for more acute intervention. Understanding verbalized. After Visit Summary given.   SUBJECTIVE: History from: Patient. Emily Mcdowell is a 28 y.o. female. Patient reports using a q-tip in left ear last night and says there is cotton in this ear  Patient has not tried anything to remove cotton Denies: cough. Normal PO intake without n/v/d.  OBJECTIVE:  Vitals:   04/26/24 1109  BP: 108/74  Pulse: 69  Resp: 18  Temp: 98 F (36.7 C)  TempSrc: Oral  SpO2: 98%    General appearance: alert; no distress Eyes: PERRLA; EOMI; conjunctiva normal HENT: white FB in LEFT EAC; EAC appears normal after removal of FB Psychological: alert and cooperative; normal mood and affect  Labs:  Labs Reviewed - No data to display  Imaging: No results found.  No Known Allergies  Past Medical History:  Diagnosis Date   Asthma    Social History   Socioeconomic History   Marital status: Single    Spouse name: Not on file   Number of children: Not on file   Years of education: Not on file   Highest education level: 12th grade  Occupational History   Not on file  Tobacco Use   Smoking status:  Never   Smokeless tobacco: Never  Vaping Use   Vaping status: Never Used  Substance and Sexual Activity   Alcohol use: Yes   Drug use: Yes    Types: Marijuana   Sexual activity: Yes    Partners: Male    Birth control/protection: Condom  Other Topics Concern   Not on file  Social History Narrative   Not on file   Social Drivers of Health   Financial Resource Strain: Low Risk  (06/25/2023)   Overall Financial Resource Strain (CARDIA)    Difficulty of Paying Living Expenses: Not hard at all  Food Insecurity: No Food Insecurity (06/25/2023)   Hunger Vital Sign    Worried About Running Out of Food in the Last Year: Never true    Ran Out of Food in the Last Year: Never true  Transportation Needs: No Transportation Needs (06/25/2023)   PRAPARE - Administrator, Civil Service (Medical): No    Lack of Transportation (Non-Medical): No  Physical Activity: Insufficiently Active (06/25/2023)   Exercise Vital Sign    Days of Exercise per Week: 3 days    Minutes of Exercise per Session: 30 min  Stress: Stress Concern Present (06/25/2023)   [employer] of Occupational Health - Occupational Stress Questionnaire    Feeling of Stress : To some extent  Social Connections: Moderately Isolated (06/25/2023)   Social Connection and Isolation  Panel    Frequency of Communication with Friends and Family: More than three times a week    Frequency of Social Gatherings with Friends and Family: More than three times a week    Attends Religious Services: More than 4 times per year    Active Member of [employer] or Organizations: No    Attends Engineer, structural: Not on file    Marital Status: Never married  Intimate Partner Violence: Not on file   Family History  Problem Relation Age of Onset   Cancer Mother    History reviewed. No pertinent surgical history.   Rolinda Rogue, MD 04/26/24 1150
